# Patient Record
Sex: Male | Born: 1965 | Race: White | Hispanic: No | Smoking: Never smoker
Health system: Southern US, Community
[De-identification: ages and names within clinical notes are randomized; demographics above are authoritative.]

## PROBLEM LIST (undated history)

## (undated) DIAGNOSIS — J1282 Pneumonia due to coronavirus disease 2019: Secondary | ICD-10-CM

## (undated) DIAGNOSIS — A419 Sepsis, unspecified organism: Secondary | ICD-10-CM

## (undated) DIAGNOSIS — Z87442 Personal history of urinary calculi: Secondary | ICD-10-CM

## (undated) DIAGNOSIS — U071 COVID-19: Secondary | ICD-10-CM

## (undated) DIAGNOSIS — C801 Malignant (primary) neoplasm, unspecified: Secondary | ICD-10-CM

## (undated) DIAGNOSIS — G7281 Critical illness myopathy: Secondary | ICD-10-CM

## (undated) DIAGNOSIS — I10 Essential (primary) hypertension: Secondary | ICD-10-CM

## (undated) DIAGNOSIS — R6521 Severe sepsis with septic shock: Secondary | ICD-10-CM

## (undated) DIAGNOSIS — E119 Type 2 diabetes mellitus without complications: Secondary | ICD-10-CM

## (undated) DIAGNOSIS — M1990 Unspecified osteoarthritis, unspecified site: Secondary | ICD-10-CM

## (undated) DIAGNOSIS — M4316 Spondylolisthesis, lumbar region: Secondary | ICD-10-CM

## (undated) DIAGNOSIS — J9621 Acute and chronic respiratory failure with hypoxia: Secondary | ICD-10-CM

## (undated) HISTORY — PX: COLONOSCOPY W/ BIOPSIES AND POLYPECTOMY: SHX1376

## (undated) HISTORY — PX: KIDNEY STONE SURGERY: SHX686

## (undated) HISTORY — PX: SKIN CANCER EXCISION: SHX779

## (undated) HISTORY — PX: BACK SURGERY: SHX140

## (undated) HISTORY — PX: CYSTOSCOPY: SUR368

---

## 2006-04-16 HISTORY — PX: LUMBAR FUSION: SHX111

## 2007-01-21 ENCOUNTER — Inpatient Hospital Stay (HOSPITAL_COMMUNITY): Admission: RE | Admit: 2007-01-21 | Discharge: 2007-01-24 | Payer: Self-pay | Admitting: Neurosurgery

## 2010-08-29 NOTE — Op Note (Signed)
NAME:  Steve Parker, Steve Parker                  ACCOUNT NO.:  [id number]   MEDICAL RECORD NO.:  [id number]          PATIENT TYPE:  INP   LOCATION:  NA                           FACILITY:  [facility]   PHYSICIAN:  Danae Orleans. Venetia Maxon, M.D.  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   PROCEDURE:  1. L3 Gill procedure.  2. L3-4 diskectomy and resection of synovial cyst.  3. Posterior lumbar interbody fusion, L3-L4 with PEEK interbody cages      and morselized bone autograft and Osteocel and number 4 with      nonsegmental instrumentation posteriorly with the pedicle screw      fixation L3-L4 levels.  4. Posterolateral arthrodesis with morselized bone autograft and      Osteocel.   SURGEON:  Danae Orleans. Venetia Maxon, M.D.   ASSISTANT:  Georgiann Cocker, RN and Hewitt Shorts, M.D.   ANESTHESIA:  General endotracheal anesthesia.   BLOOD LOSS:  250 mL   COMPLICATIONS:  None.   DISPOSITION:  Recovery.   INDICATIONS:  Steve Parker is a 45 year old morbidly obese man with L3  pars defects bilaterally with marked spondylolisthesis of L3 on 4 with a  large synovial cyst causing significant nerve root compression and  additionally a herniated disk at the L3-4 level on the left.  He has  severe left leg pain.  It was elected to take him to surgery for  decompression and fusion at this affected level.   PROCEDURE:  Mr. Martino was brought to the operating room.  Following  satisfactory and uncomplicated induction of general endotracheal  anesthesia and placement of intravenous lines, Foley catheter, he was  placed in a prone position on the  [place] table.  His soft tissue and  bony prominences were padded appropriately.  Skin was then shaved and  then area of planned incision was infiltrated with 0.25% Marcaine and  0.5% lidocaine with 1:300,000 epinephrine after prepping draping the  usual fashion.  Incision was carried through copious adipose tissue to  the lumbodorsal fascia which was incised bilaterally exposing the L3  transverse processes bilaterally and L4 transverse processes  bilaterally.  There was marked degeneration and hypermobility of the L3  posterior elements.  After intraoperative x-ray was performed confirming  correct level, a Gill procedure of L3 was performed with removal of  posterior elements and resection of synovial cyst which was causing a  significant spinal canal stenosis.  The lateral aspect of spinal canal  were decompressed with Kerrison rongeurs and hypertrophied ligamentous  tissue was also removed.  The L3 and L4 nerve roots were decompressed  widely as they extended out the neural foramina.  There was a fragment  of herniated disk material compressing the left L3  and L4 nerve roots  and the fragment of disk was removed.  Subsequently laminar spreader was  placed to open up the interspace and thorough diskectomy was performed  bilaterally using a variety of pituitary rongeurs and end plate  preparation curettes.  After trial sizing it was elected to use a 10-mm  PEEK interbody cage.  This was packed with morcellized bone autograft  and also with Osteocel and the 10 mm cages were placed on either side of  the interspace.  In addition a significant amount of additional of bone  graft material and Osteocel was placed and this was tamped into  position.  Subsequently, intraoperative x-ray demonstrated well-  positioned interbody cages, pedicle screw fixation was then utilized  with a 45 x 6.5 mm screws, two at L3 and two at L4.  All screws had  excellent purchase and their positioning was  confirmed on AP and lateral  fluoroscopy.  The posterolateral region had been decorticated prior to  placing pedicle screws and remaining bone graft was placed bilaterally  L3 through L4 levels.  40-mm rods were placed and locked down in situ.  Prior to placing bone graft, the wound was extensively irrigated with  bacitracin saline.  Self-retaining tractor was removed.  The lumbodorsal  fascia was closed with 1 Vicryl sutures, subcutaneous tissues were  approximated 2-0 Vicryl interrupted inverted sutures.  Skin edges  reapproximated interrupted 3-0 Vicryl subcuticular stitch.  Wound was  dressed with Benzoin, Steri-Strips, Telfa gauze and tape.  The patient  was extubated in the operating room and taken to the recovery room in  stable and satisfactory condition having tolerated his operation well.  All counts correct at the end of the case.      Danae Orleans. Venetia Maxon, M.D.  Electronically Signed     JDS/MEDQ  D:  01/21/2007  T:  01/22/2007  Job:  [id number]

## 2010-09-01 NOTE — Discharge Summary (Signed)
NAME:  Steve Parker, Steve Parker                  ACCOUNT NO.:  [id number]   MEDICAL RECORD NO.:  [id number]          PATIENT TYPE:  INP   LOCATION:  3035                         FACILITY:  [facility]   PHYSICIAN:  Danae Orleans. Venetia Maxon, M.D.  DATE OF BIRTH:  03/22/1966   DATE OF ADMISSION:  01/21/2007  DATE OF DISCHARGE:  01/24/2007                               DISCHARGE SUMMARY   REASON FOR ADMISSION:  1. Lumbar disk herniation.  2. Spondylolisthesis.  3. Synovial cyst.  4. Long-term use of steroids.  5. Morbid obesity.  6. Disk degeneration.  7. Esophageal reflux.  8. Hypertension.   DISCHARGE DIAGNOSES:  1. Lumbar disk herniation.  2. Spondylolisthesis.  3. Synovial cyst.  4. Long-term use of steroids.  5. Morbid obesity.  6. Disk degeneration.  7. Esophageal reflux.  8. Hypertension.   HOSPITAL COURSE:  Steve Parker is a 45 year old, morbidly obese man with  L3-4 spondylolisthesis and spondylolysis.  He had a large disk  herniation and severe left leg pain.  He was admitted to the hospital on  same day as procedure basis and underwent L3 Gill procedure with L3-4  decompression, diskectomy, posterior lumbar interbody fusion and pedicle  screw fixation with posterolateral arthrodesis.  He mobilized slow after  surgery and was up and ambulating and was discharged home on October 10,  in stable, satisfactory condition having tolerated his operation and  hospitalization well.   DISCHARGE MEDICATIONS:  1. Percocet 10/325 one to two every 4-6 hours as needed for pain.  2. Flexeril 10 mg up to 3 times daily as needed.   FOLLOW UP:  Follow up with Dr. Venetia Maxon in the office in 3 weeks.     Danae Orleans. Venetia Maxon, M.D.  Electronically Signed    JDS/MEDQ  D:  02/13/2007  T:  02/14/2007  Job:  [id number]

## 2011-01-25 LAB — CBC
HCT: 48.9
Hemoglobin: 16.5
MCHC: 33.8
MCV: 85.8
Platelets: 300
RBC: 5.7
RDW: 12.7
WBC: 9.1

## 2011-01-25 LAB — BASIC METABOLIC PANEL
CO2: 27
Chloride: 103
GFR calc Af Amer: >60
Potassium: 4.5
Sodium: 141

## 2011-01-25 LAB — TYPE AND SCREEN: Antibody Screen: NEGATIVE

## 2011-01-25 LAB — ABO/RH: ABO/RH(D): O POS

## 2018-06-02 ENCOUNTER — Other Ambulatory Visit: Payer: Self-pay | Admitting: Neurosurgery

## 2018-06-02 DIAGNOSIS — S0540XA Penetrating wound of orbit with or without foreign body, unspecified eye, initial encounter: Secondary | ICD-10-CM

## 2018-06-02 DIAGNOSIS — M4316 Spondylolisthesis, lumbar region: Secondary | ICD-10-CM

## 2018-06-12 ENCOUNTER — Ambulatory Visit
Admission: RE | Admit: 2018-06-12 | Discharge: 2018-06-12 | Disposition: A | Discharge status: Home / Self Care | Payer: 59 | Source: Ambulatory Visit | Attending: Neurosurgery | Admitting: Neurosurgery

## 2018-06-12 DIAGNOSIS — M4316 Spondylolisthesis, lumbar region: Secondary | ICD-10-CM

## 2018-06-12 DIAGNOSIS — S0540XA Penetrating wound of orbit with or without foreign body, unspecified eye, initial encounter: Secondary | ICD-10-CM

## 2018-06-12 MED ORDER — GADOBENATE DIMEGLUMINE 529 MG/ML IV SOLN
20.0000 mL | Freq: Once | INTRAVENOUS | Status: AC | PRN
  Administered 2018-06-12: 20 mL via INTRAVENOUS

## 2018-06-13 ENCOUNTER — Other Ambulatory Visit: Payer: Self-pay

## 2018-06-17 ENCOUNTER — Other Ambulatory Visit: Payer: Self-pay | Admitting: Neurosurgery

## 2018-07-18 NOTE — Pre-Procedure Instructions (Signed)
Steve Parker  07/18/2018     [facility], [place] [address] [place] [address] Phone: [phone] Fax: [phone]   Your procedure is scheduled on Tuesday, July 29, 2018  Report to [facility] at 5:30 A.M.  Call this number if you have problems the morning of surgery:  [phone]   Remember: Brush your teeth the morning of surgery with your regular toothpaste.  Do not eat or drink after midnight Monday, July 28, 2018  Take these medicines the morning of surgery with A SIP OF WATER : amLODipine (NORVASC), if needed: traMADol Veatrice Bourbon) for pain  Stop taking Aspirin (unless otherwise advised by surgeon), vitamins, fish oil and herbal medications. Do not take any NSAIDs ie: Ibuprofen, Advil, Naproxen (Aleve), Motrin, BC and Goody Powder; stop Tuesday 07/22/18   How to Manage Your Diabetes Before and After Surgery  Why is it important to control my blood sugar before and after surgery? . Improving blood sugar levels before and after surgery helps healing and can limit problems. . A way of improving blood sugar control is eating a healthy diet by: o  Eating less sugar and carbohydrates o  Increasing activity/exercise o  Talking with your doctor about reaching your blood sugar goals . High blood sugars (greater than 180 mg/dL) can raise your risk of infections and slow your recovery, so you will need to focus on controlling your diabetes during the weeks before surgery. . Make sure that the doctor who takes care of your diabetes knows about your planned surgery including the date and location.  How do I manage my blood sugar before surgery? . Check your blood sugar at least 4 times a day, starting 2 days before surgery, to make sure that the level is not too high or low. o Check your blood sugar the morning of your surgery when you wake up and every 2 hours until you get to the Short Stay unit. . If your  blood sugar is less than 70 mg/dL, you will need to treat for low blood sugar: o Do not take insulin. o Treat a low blood sugar (less than 70 mg/dL) with  cup of clear juice (cranberry or apple), 4 glucose tablets, OR glucose gel. Recheck blood sugar in 15 minutes after treatment (to make sure it is greater than 70 mg/dL). If your blood sugar is not greater than 70 mg/dL on recheck, call [phone] o  for further instructions. . Report your blood sugar to the short stay nurse when you get to Short Stay.  . If you are admitted to the hospital after surgery: o Your blood sugar will be checked by the staff and you will probably be given insulin after surgery (instead of oral diabetes medicines) to make sure you have good blood sugar levels. o The goal for blood sugar control after surgery is 80-180 mg/dL.  WHAT DO I DO ABOUT MY DIABETES MEDICATION? Do not take glyBURIDE (DIABETA) the night before surgery  . Do not take oral diabetes medicines (pills) the morning of surgery such as metFORMIN (GLUCOPHAGE) and glyBURIDE (DIABETA)  . THE NIGHT BEFORE SURGERY, take 15 units of LEVEMIR  insulin.    Reviewed and Endorsed by [facility] Patient Education Committee, August 2015   Do not wear jewelry, make-up or nail polish.  Do not wear lotions, powders, or perfumes, or deodorant.  Do not shave 48 hours prior to surgery.  Men may shave  face and neck.  Do not bring valuables to the hospital.  [facility] is not responsible for any belongings or valuables.  Contacts, dentures or bridgework may not be worn into surgery.  Leave your suitcase in the car.  After surgery it may be brought to your room. Patients discharged the day of surgery will not be allowed to drive home.  Special instructions:See " [place] Preparing For Surgery" sheet. Please read over the following fact sheets that you were given. Pain Booklet, Coughing and Deep Breathing, MRSA Information and Surgical Site Infection Prevention

## 2018-07-21 ENCOUNTER — Other Ambulatory Visit: Payer: Self-pay

## 2018-07-21 ENCOUNTER — Encounter (HOSPITAL_COMMUNITY): Payer: Self-pay

## 2018-07-21 ENCOUNTER — Encounter (HOSPITAL_COMMUNITY)
Admission: RE | Admit: 2018-07-21 | Discharge: 2018-07-21 | Disposition: A | Discharge status: Home / Self Care | Payer: 59 | Source: Ambulatory Visit | Attending: Neurosurgery | Admitting: Neurosurgery

## 2018-07-21 DIAGNOSIS — Z01818 Encounter for other preprocedural examination: Secondary | ICD-10-CM | POA: Diagnosis not present

## 2018-07-21 DIAGNOSIS — I10 Essential (primary) hypertension: Secondary | ICD-10-CM | POA: Insufficient documentation

## 2018-07-21 DIAGNOSIS — R9431 Abnormal electrocardiogram [ECG] [EKG]: Secondary | ICD-10-CM | POA: Diagnosis not present

## 2018-07-21 HISTORY — DX: Malignant (primary) neoplasm, unspecified: C80.1

## 2018-07-21 HISTORY — DX: Personal history of urinary calculi: Z87.442

## 2018-07-21 HISTORY — DX: Essential (primary) hypertension: I10

## 2018-07-21 HISTORY — DX: Type 2 diabetes mellitus without complications: E11.9

## 2018-07-21 HISTORY — DX: Spondylolisthesis, lumbar region: M43.16

## 2018-07-21 LAB — BASIC METABOLIC PANEL
Anion gap: 8 (ref 5–15)
BUN: 14 mg/dL (ref 6–20)
CO2: 25 mmol/L (ref 22–32)
Calcium: 9.2 mg/dL (ref 8.9–10.3)
Chloride: 108 mmol/L (ref 98–111)
Creatinine, Ser: 1.03 mg/dL (ref 0.61–1.24)
GFR calc Af Amer: >60 mL/min (ref >60)
GFR calc non Af Amer: >60 mL/min (ref >60)
Glucose, Bld: 163 mg/dL — ABNORMAL HIGH (ref 70–99)
Potassium: 4.8 mmol/L (ref 3.5–5.1)
Sodium: 141 mmol/L (ref 135–145)

## 2018-07-21 LAB — CBC
HCT: 50.7 % (ref 39.0–52.0)
Hemoglobin: 15.3 g/dL (ref 13.0–17.0)
MCH: 26.9 pg (ref 26.0–34.0)
MCHC: 30.2 g/dL (ref 30.0–36.0)
MCV: 89.3 fL (ref 80.0–100.0)
Platelets: 280 10*3/uL (ref 150–400)
RBC: 5.68 MIL/uL (ref 4.22–5.81)
RDW: 13.1 % (ref 11.5–15.5)
WBC: 9.3 10*3/uL (ref 4.0–10.5)
nRBC: 0 % (ref 0.0–0.2)

## 2018-07-21 LAB — TYPE AND SCREEN
ABO/RH(D): O POS
Antibody Screen: NEGATIVE

## 2018-07-21 LAB — SURGICAL PCR SCREEN
MRSA, PCR: NEGATIVE
Staphylococcus aureus: NEGATIVE

## 2018-07-21 LAB — GLUCOSE, CAPILLARY: Glucose-Capillary: 151 mg/dL — ABNORMAL HIGH (ref 70–99)

## 2018-07-21 LAB — HEMOGLOBIN A1C
Hgb A1c MFr Bld: 11.8 % — ABNORMAL HIGH (ref 4.8–5.6)
Mean Plasma Glucose: 291.96 mg/dL

## 2018-07-21 NOTE — Progress Notes (Signed)
Anesthesia Chart Review:  Case:  [id number] Date/Time:  08/28/18 0715   Procedure:  Lumbar 4-5, Lumbar 5 Sacral 1 Posterior lumbar interbody fusion with exploration of Lumbar 3-4 Fusion (N/A Back) - Lumbar 4-5, Lumbar 5 Sacral 1 Posterior lumbar interbody fusion with exploration of Lumbar 3-4 Fusion   Anesthesia type:  General   Pre-op diagnosis:  Spondylolisthesis, Lumbar region   Location:  [facility] OR ROOM 21 / [place] [facility]   Surgeon:  Erline Levine, MD      DISCUSSION: 53 yo male never smoker. Pertinent hx includes HTN, DMII.  PVC bigeminy noted on preop EKG. No prior tracing for comparison. Pt denies any CV symptoms, says he has actually intentionally lost 20+ lbs over the past 2 months in preparation for surgery.Tracing faxed to PCP Dr. Unk Lightning for review and asked for input regarding whether any additional workup planned prior to surgery. Preop labs also significant for A1c 11.8; this result was also routed to PCP as well as called to Dr. Melven Sartorius office.  Case appears to have been postponed to 5/14. Pt should be cleared by PCP prior to eventual surgery given uncontrolled DMII.  VS: BP (!) 145/82   Pulse 80   Temp 36.6 C (Oral)   Resp 18   Ht 6\' 1"  (1.854 m)   Wt 119.3 kg   SpO2 100%   BMI 34.71 kg/m   PROVIDERS: Janace Litten, MD is PCP  LABS: Uncontrolled DMII (all labs ordered are listed, but only abnormal results are displayed)  Labs Reviewed  GLUCOSE, CAPILLARY - Abnormal; Notable for the following components:      Result Value   Glucose-Capillary 151 (*)    All other components within normal limits  HEMOGLOBIN A1C - Abnormal; Notable for the following components:   Hgb A1c MFr Bld 11.8 (*)    All other components within normal limits  BASIC METABOLIC PANEL - Abnormal; Notable for the following components:   Glucose, Bld 163 (*)    All other components within normal limits  SURGICAL PCR SCREEN  CBC  TYPE AND SCREEN      EKG: 07/21/2018: Sinus rhythm with frequent  Premature ventricular complexes in a pattern of bigeminy. Rate 74.  CV: N/A  Past Medical History:  Diagnosis Date  . 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 Diabetes mellitus without complication ([place])   . History of kidney stones   . Hypertension   . Spondylolisthesis of lumbar region     Past Surgical History:  Procedure Laterality Date  . BACK SURGERY    . COLONOSCOPY W/ BIOPSIES AND POLYPECTOMY    . KIDNEY STONE SURGERY    . SKIN CANCER EXCISION      MEDICATIONS: . amLODipine (NORVASC) 5 MG tablet  . glyBURIDE (DIABETA) 5 MG tablet  . LEVEMIR FLEXTOUCH 100 UNIT/ML Pen  . lisinopril (PRINIVIL,ZESTRIL) 40 MG tablet  . metFORMIN (GLUCOPHAGE) 1000 MG tablet  . traMADol (ULTRAM) 50 MG tablet   No current facility-administered medications for this encounter.    Wynonia Musty [facility] Short Stay Center/Anesthesiology Phone [phone] 07/23/2018 8:48 AM

## 2018-07-21 NOTE — Progress Notes (Signed)
Pt denies SOB, chest pain, and being under the care of a cardiologist. Pt denies having a stress test, echo and cardiac cath. Pt stated that he is under the care of Dr. Unk Lightning, PCP. Pt denies having an EKG chest x ray within the last year. Pt denies recent labs. Pt stated that last A1c was 5 months ago. Pt stated that latest FBG ranges between 50's -60's.   Coronavirus Screening  Pt denies that he and family members have experienced the following symptoms:  Cough yes/no: No Fever (>100.54F)  yes/no: No Runny nose yes/no: No Sore throat yes/no: No Difficulty breathing/shortness of breath  yes/no: No  Have you or a family member traveled in the last 14 days and where? yes/no: No  Pt reminded that hospital visitation restrictions are in effect and the importance of the restrictions.  Pt verbalized understanding of all pre-op instructions.  PA, Anesthesiology, asked to review pt abnormal labs and EKG.

## 2018-09-11 NOTE — Pre-Procedure Instructions (Signed)
Steve Parker  09/11/2018     [facility] [address] [place] [address] [place] [address] Phone: [phone] Fax: [phone]   Your procedure is scheduled on Tuesday, June 2nd  Report to [facility] Entrance A at  7:00 A.M.  Call this number if you have problems the morning of surgery:  [phone]   Remember:  Do not eat or drink after midnight.    Take these medicines the morning of surgery with A SIP OF WATER   amLODipine (NORVASC)  If needed - traMADol (ULTRAM)   Follow your surgeon's instructions on when to stop Aspirin.  If no instructions were given by your surgeon then you will need to call the office to get those instructions.    7 days prior to surgery STOP taking any Aspirin (unless otherwise instructed by your surgeon), Aleve, Naproxen, Ibuprofen, Motrin, Advil, Goody's, BC's, all herbal medications, fish oil, and all vitamins.  How to Manage Your Diabetes Before and After Surgery  Why is it important to control my blood sugar before and after surgery? . Improving blood sugar levels before and after surgery helps healing and can limit problems. . A way of improving blood sugar control is eating a healthy diet by: o  Eating less sugar and carbohydrates o  Increasing activity/exercise o  Talking with your doctor about reaching your blood sugar goals . High blood sugars (greater than 180 mg/dL) can raise your risk of infections and slow your recovery, so you will need to focus on controlling your diabetes during the weeks before surgery. . Make sure that the doctor who takes care of your diabetes knows about your planned surgery including the date and location.  How do I manage my blood sugar before surgery? . Check your blood sugar at least 4 times a day, starting 2 days before surgery, to make sure that the level is not too high or low. o Check your blood sugar the morning of your surgery when you wake up and every  2 hours until you get to the Short Stay unit. . If your blood sugar is less than 70 mg/dL, you will need to treat for low blood sugar: o Do not take insulin. o Treat a low blood sugar (less than 70 mg/dL) with  cup of clear juice (cranberry or apple), 4 glucose tablets, OR glucose gel. Recheck blood sugar in 15 minutes after treatment (to make sure it is greater than 70 mg/dL). If your blood sugar is not greater than 70 mg/dL on recheck, call [phone] o  for further instructions. . Report your blood sugar to the short stay nurse when you get to Short Stay.  . If you are admitted to the hospital after surgery: o Your blood sugar will be checked by the staff and you will probably be given insulin after surgery (instead of oral diabetes medicines) to make sure you have good blood sugar levels. o The goal for blood sugar control after surgery is 80-180 mg/dL.  WHAT DO I DO ABOUT MY DIABETES MEDICATION?  Marland Kitchen Do not take metFORMIN (GLUCOPHAGE) or glyBURIDE (DIABETA)/oral diabetes medicines (pills) the morning of surgery.  . THE NIGHT BEFORE SURGERY, take 15 units of LEVEMIR insulin.     . The day of surgery, do not take other diabetes injectables, including Byetta (exenatide), Bydureon (exenatide ER), Victoza (liraglutide), or Trulicity (dulaglutide).  . If your CBG is greater than 220 mg/dL, you may take  of  your sliding scale (correction) dose of insulin.  Reviewed and Endorsed by [facility] Patient Education Committee, August 2015   Do not wear jewelry, make-up or nail polish.  Do not wear lotions, powders, or perfumes, or deodorant.  Do not shave 48 hours prior to surgery.  Men may shave face and neck.  Do not bring valuables to the hospital.  [facility] is not responsible for any belongings or valuables.  Contacts, dentures or bridgework may not be worn into surgery.  Leave your suitcase in the car.  After surgery it may be brought to your room.  For patients admitted to the  hospital, discharge time will be determined by your treatment team.  Patients discharged the day of surgery will not be allowed to drive home.   Special instructions:                                        [place]- Preparing For Surgery  Before surgery, you can play an important role. Because skin is not sterile, your skin needs to be as free of germs as possible. You can reduce the number of germs on your skin by washing with CHG (chlorahexidine gluconate) Soap before surgery.  CHG is an antiseptic cleaner which kills germs and bonds with the skin to continue killing germs even after washing.    Oral Hygiene is also important to reduce your risk of infection.  Remember - BRUSH YOUR TEETH THE MORNING OF SURGERY WITH YOUR REGULAR TOOTHPASTE  Please do not use if you have an allergy to CHG or antibacterial soaps. If your skin becomes reddened/irritated stop using the CHG.  Do not shave (including legs and underarms) for at least 48 hours prior to first CHG shower. It is OK to shave your face.  Please follow these instructions carefully.   1. Shower the NIGHT BEFORE SURGERY and the MORNING OF SURGERY with CHG.   2. If you chose to wash your hair, wash your hair first as usual with your normal shampoo.  3. After you shampoo, rinse your hair and body thoroughly to remove the shampoo.  4. Use CHG as you would any other liquid soap. You can apply CHG directly to the skin and wash gently with a scrungie or a clean washcloth.   5. Apply the CHG Soap to your body ONLY FROM THE NECK DOWN.  Do not use on open wounds or open sores. Avoid contact with your eyes, ears, mouth and genitals (private parts). Wash Face and genitals (private parts)  with your normal soap.  6. Wash thoroughly, paying special attention to the area where your surgery will be performed.  7. Thoroughly rinse your body with warm water from the neck down.  8. DO NOT shower/wash with your normal soap after using and rinsing off  the CHG Soap.  9. Pat yourself dry with a CLEAN TOWEL.  10. Wear CLEAN PAJAMAS to bed the night before surgery, wear comfortable clothes the morning of surgery  11. Place CLEAN SHEETS on your bed the night of your first shower and DO NOT SLEEP WITH PETS.  Day of Surgery:  Do not apply any deodorants/lotions.  Please wear clean clothes to the hospital/surgery center.   Remember to brush your teeth WITH YOUR REGULAR TOOTHPASTE.  Please read over the following fact sheets that you were given. Pain Booklet, Coughing and Deep Breathing, MRSA Information and  Surgical Site Infection Prevention

## 2018-09-12 ENCOUNTER — Encounter (HOSPITAL_COMMUNITY): Payer: Self-pay

## 2018-09-12 ENCOUNTER — Encounter (HOSPITAL_COMMUNITY)
Admission: RE | Admit: 2018-09-12 | Discharge: 2018-09-12 | Disposition: A | Discharge status: Home / Self Care | Payer: 59 | Source: Ambulatory Visit | Attending: Neurosurgery | Admitting: Neurosurgery

## 2018-09-12 ENCOUNTER — Other Ambulatory Visit (HOSPITAL_COMMUNITY)
Admission: RE | Admit: 2018-09-12 | Discharge: 2018-09-12 | Disposition: A | Discharge status: Home / Self Care | Payer: 59 | Source: Ambulatory Visit | Attending: Neurosurgery | Admitting: Neurosurgery

## 2018-09-12 ENCOUNTER — Other Ambulatory Visit: Payer: Self-pay

## 2018-09-12 ENCOUNTER — Encounter (HOSPITAL_COMMUNITY): Payer: Self-pay | Admitting: *Deleted

## 2018-09-12 DIAGNOSIS — Z1159 Encounter for screening for other viral diseases: Secondary | ICD-10-CM | POA: Diagnosis not present

## 2018-09-12 DIAGNOSIS — Z01812 Encounter for preprocedural laboratory examination: Secondary | ICD-10-CM | POA: Insufficient documentation

## 2018-09-12 HISTORY — DX: Unspecified osteoarthritis, unspecified site: M19.90

## 2018-09-12 LAB — CBC
HCT: 49.8 % (ref 39.0–52.0)
Hemoglobin: 15.6 g/dL (ref 13.0–17.0)
MCH: 27.5 pg (ref 26.0–34.0)
MCHC: 31.3 g/dL (ref 30.0–36.0)
MCV: 87.8 fL (ref 80.0–100.0)
Platelets: 309 10*3/uL (ref 150–400)
RBC: 5.67 MIL/uL (ref 4.22–5.81)
RDW: 13.3 % (ref 11.5–15.5)
WBC: 12.2 10*3/uL — ABNORMAL HIGH (ref 4.0–10.5)
nRBC: 0 % (ref 0.0–0.2)

## 2018-09-12 LAB — BASIC METABOLIC PANEL
Anion gap: 10 (ref 5–15)
BUN: 9 mg/dL (ref 6–20)
CO2: 24 mmol/L (ref 22–32)
Calcium: 9.7 mg/dL (ref 8.9–10.3)
Chloride: 107 mmol/L (ref 98–111)
Creatinine, Ser: 1.06 mg/dL (ref 0.61–1.24)
GFR calc Af Amer: >60 mL/min (ref >60)
GFR calc non Af Amer: >60 mL/min (ref >60)
Glucose, Bld: 84 mg/dL (ref 70–99)
Potassium: 4.3 mmol/L (ref 3.5–5.1)
Sodium: 141 mmol/L (ref 135–145)

## 2018-09-12 LAB — SURGICAL PCR SCREEN
MRSA, PCR: NEGATIVE
Staphylococcus aureus: NEGATIVE

## 2018-09-12 LAB — TYPE AND SCREEN
ABO/RH(D): O POS
Antibody Screen: NEGATIVE

## 2018-09-12 LAB — GLUCOSE, CAPILLARY: Glucose-Capillary: 96 mg/dL (ref 70–99)

## 2018-09-12 NOTE — Anesthesia Preprocedure Evaluation (Addendum)
Anesthesia Evaluation  Patient identified by MRN, date of birth, ID band Patient awake    Reviewed: Allergy & Precautions, NPO status , Patient's Chart, lab work & pertinent test results  History of Anesthesia Complications Negative for: history of anesthetic complications  Airway Mallampati: IV  TM Distance: >3 FB Neck ROM: Full    Dental  (+) Teeth Intact   Pulmonary neg pulmonary ROS, neg recent URI,    breath sounds clear to auscultation       Cardiovascular hypertension, Pt. on medications (-) angina(-) Past MI and (-) CHF  Rhythm:Regular     Neuro/Psych negative neurological ROS  negative psych ROS   GI/Hepatic negative GI ROS, Neg liver ROS,   Endo/Other  diabetes, Type 2Morbid obesity  Renal/GU      Musculoskeletal  (+) Arthritis ,   Abdominal   Peds  Hematology   Anesthesia Other Findings   Reproductive/Obstetrics                            Anesthesia Physical Anesthesia Plan  ASA: II  Anesthesia Plan: General   Post-op Pain Management:    Induction: Intravenous  PONV Risk Score and Plan: 2 and Ondansetron and Dexamethasone  Airway Management Planned: Oral ETT  Additional Equipment: None  Intra-op Plan:   Post-operative Plan: Extubation in OR  Informed Consent: I have reviewed the patients History and Physical, chart, labs and discussed the procedure including the risks, benefits and alternatives for the proposed anesthesia with the patient or authorized representative who has indicated his/her understanding and acceptance.     Dental advisory given  Plan Discussed with: CRNA and Surgeon  Anesthesia Plan Comments: (See recent PAT note by Karoline Caldwell, PA-C 07/21/18. In the interim, pt has been seen by his PCP for management of DMII and by cardiology for eval of PVCs and cleared for surgery. A1c 6.5 on 5/29, down from 11.8 previously.  TTE 08/27/18 (care  everywhere): Summary Normal mitral valve structure and mobility. No significant regurgitation. There is trace aortic sclerosis noted, with no evidence of stenosis. No aortic stenosis. No aortic regurgitation. Normal left ventricle size and systolic function, with no segmental abnormality. ejection fraction estimated 63% abnormal diastolic function)     Anesthesia Quick Evaluation

## 2018-09-12 NOTE — Progress Notes (Addendum)
PCP - Dr Janace Litten  Cardiologist - Dr Abran Richard Chest x-ray -   EKG - 4/13/2020[facility]- requested EKG tacing  Stress Test - na  ECHO - 08/17/2018- careeverywhere  Cardiac Cath - no  Sleep Study - no CPAP - na  LABS-CBC, BMP, T/S. PCR  ASA-na  HA1C-  Fasting Blood Sugar - 58- 130 Checks Blood Sugar ___rarely checks__   AnesthesiaKaroline Caldwell, PA-C to review  Pt denies having chest pain, sob, or fever at this time. All instructions explained to the pt, with a verbal understanding of the material. Pt agrees to go over the instructions while at home for a better understanding. The opportunity to ask questions was provided.  Mr Rosendahl will have COVID test done today and he knows to stay at home and no one should come in his house.

## 2018-09-12 NOTE — Pre-Procedure Instructions (Signed)
Steve Parker  09/12/2018     Your procedure is scheduled on Tuesday, June 2nd  Report to [facility] Entrance A at  7:00 A.M.  Call this number if you have problems the morning of surgery:  [phone]   Do Not take Glyburide Monday evening or Tuesday AM D Not take Metformin Tuesday AM    Remember:  Do not eat or drink after midnight, June 1    Take these medicines the morning of surgery with A SIP OF WATER:  amLODipine (NORVASC)  If needed - traMADol (ULTRAM)   Follow your surgeon's instructions on when to stop Aspirin.  If no instructions were given by your surgeon then you will need to call the office to get those instructions.    7 days prior to surgery STOP taking any Aspirin (unless otherwise instructed by your surgeon), Aleve, Naproxen, Ibuprofen, Motrin, Advil, Goody's, BC's, all herbal medications, fish oil, and all vitamins.  How to Manage Your Diabetes Before and After Surgery  Why is it important to control my blood sugar before and after surgery? . Improving blood sugar levels before and after surgery helps healing and can limit problems. . A way of improving blood sugar control is eating a healthy diet by: o  Eating less sugar and carbohydrates o  Increasing activity/exercise o  Talking with your doctor about reaching your blood sugar goals . High blood sugars (greater than 180 mg/dL) can raise your risk of infections and slow your recovery, so you will need to focus on controlling your diabetes during the weeks before surgery. . Make sure that the doctor who takes care of your diabetes knows about your planned surgery including the date and location.  How do I manage my blood sugar before surgery? . Check your blood sugar at least 4 times a day, starting 2 days before surgery, to make sure that the level is not too high or low. o Check your blood sugar the morning of your surgery when you wake up and every 2 hours until you get to the Short Stay  unit. . If your blood sugar is less than 70 mg/dL, you will need to treat for low blood sugar: o Do not take insulin. o Treat a low blood sugar (less than 70 mg/dL) with  cup of clear juice (cranberry or apple), 4 glucose tablets, OR glucose gel. Recheck blood sugar in 15 minutes after treatment (to make sure it is greater than 70 mg/dL). If your blood sugar is not greater than 70 mg/dL on recheck, call [phone] o  for further instructions. . Report your blood sugar to the short stay nurse when you get to Short Stay.  . If you are admitted to the hospital after surgery: o Your blood sugar will be checked by the staff and you will probably be given insulin after surgery (instead of oral diabetes medicines) to make sure you have good blood sugar levels. o The goal for blood sugar control after surgery is 80-180 mg/dL.  WHAT DO I DO ABOUT MY DIABETES MEDICATION?  Marland Kitchen Do not take metFORMIN (GLUCOPHAGE) or glyBURIDE (DIABETA)/oral diabetes medicines (pills) the morning of surgery.  . THE NIGHT BEFORE SURGERY, take 15 units of LEVEMIR insulin.       Special instructions:                                   [facility]- Preparing For  Surgery  Before surgery, you can play an important role. Because skin is not sterile, your skin needs to be as free of germs as possible. You can reduce the number of germs on your skin by washing with CHG (chlorahexidine gluconate) Soap before surgery.  CHG is an antiseptic cleaner which kills germs and bonds with the skin to continue killing germs even after washing.    Oral Hygiene is also important to reduce your risk of infection.  Remember - BRUSH YOUR TEETH THE MORNING OF SURGERY WITH YOUR REGULAR TOOTHPASTE  Please do not use if you have an allergy to CHG or antibacterial soaps. If your skin becomes reddened/irritated stop using the CHG.  Do not shave (including legs and underarms) for at least 48 hours prior to first CHG shower. It is OK to shave your  face.  Please follow these instructions carefully.   1. Shower the NIGHT BEFORE SURGERY and the MORNING OF SURGERY with CHG.   2. If you chose to wash your hair, wash your hair first as usual with your normal shampoo.  3. After you shampoo, wash your face and private area with the soap you use at home, then rinse your hair and body thoroughly to remove the shampoo and soap.  4. Use CHG as you would any other liquid soap. You can apply CHG directly to the skin and wash gently with a scrungie or a clean washcloth.   5. Apply the CHG Soap to your body ONLY FROM THE NECK DOWN.  Do not use on open wounds or open sores. Avoid contact with your eyes, ears, mouth and genitals (private parts).   6. Wash thoroughly, paying special attention to the area where your surgery will be performed.  7. Thoroughly rinse your body with warm water from the neck down.  8. DO NOT shower/wash with your normal soap after using and rinsing off the CHG Soap.  9. Pat yourself dry with a CLEAN TOWEL.  10. Wear CLEAN PAJAMAS to bed the night before surgery, wear comfortable clothes the morning of surgery  11. Place CLEAN SHEETS on your bed the night of your first shower and DO NOT SLEEP WITH PETS.  Day of Surgery: Shower as Instructed Above Do not wear lotions, powders, or perfumes, or deodorant. Please wear clean clothes to the hospital/surgery center.   Remember to brush your teeth WITH YOUR REGULAR TOOTHPASTE.             Do not wear jewelry, make-up or nail polish.  Men may shave face and neck. Do not wear jewelry, piercing.               Do not bring valuables to the hospital.  [facility] is not responsible for any belongings or valuables.  Contacts, dentures or bridgework may not be worn into surgery.  Leave your suitcase in the car.  After surgery it may be brought to your room.  For patients admitted to the hospital, discharge time will be determined by your treatment team.  Patients discharged  the day of surgery will not be allowed to drive home.   Please read over the following fact sheets that you were given.  Coughing and Deep Breathing, Surgical Site Infections

## 2018-09-13 LAB — NOVEL CORONAVIRUS, NAA (HOSP ORDER, SEND-OUT TO REF LAB; TAT 18-24 HRS): SARS-CoV-2, NAA: NOT DETECTED

## 2018-09-13 LAB — HEMOGLOBIN A1C
Hgb A1c MFr Bld: 6.5 % — ABNORMAL HIGH (ref 4.8–5.6)
Mean Plasma Glucose: 140 mg/dL

## 2018-09-15 MED ORDER — DEXTROSE 5 % IV SOLN
3.0000 g | INTRAVENOUS | Status: AC
  Administered 2018-09-16 (×2): 3 g via INTRAVENOUS
  Filled 2018-09-15: qty 3

## 2018-09-16 ENCOUNTER — Inpatient Hospital Stay (HOSPITAL_COMMUNITY): Payer: 59

## 2018-09-16 ENCOUNTER — Inpatient Hospital Stay (HOSPITAL_COMMUNITY)
Admission: RE | Admit: 2018-09-16 | Discharge: 2018-09-17 | DRG: 455 | Disposition: A | Discharge status: Home / Self Care | Payer: 59 | Attending: Neurosurgery | Admitting: Neurosurgery

## 2018-09-16 ENCOUNTER — Inpatient Hospital Stay (HOSPITAL_COMMUNITY): Payer: 59 | Admitting: Certified Registered Nurse Anesthetist

## 2018-09-16 ENCOUNTER — Encounter (HOSPITAL_COMMUNITY): Payer: Self-pay | Admitting: *Deleted

## 2018-09-16 ENCOUNTER — Inpatient Hospital Stay (HOSPITAL_COMMUNITY): Payer: 59 | Admitting: Physician Assistant

## 2018-09-16 ENCOUNTER — Other Ambulatory Visit: Payer: Self-pay

## 2018-09-16 ENCOUNTER — Inpatient Hospital Stay (HOSPITAL_COMMUNITY)
Admission: RE | Disposition: A | Discharge status: Home / Self Care | Payer: Self-pay | Source: Home / Self Care | Attending: Neurosurgery

## 2018-09-16 DIAGNOSIS — I10 Essential (primary) hypertension: Secondary | ICD-10-CM | POA: Diagnosis present

## 2018-09-16 DIAGNOSIS — M48061 Spinal stenosis, lumbar region without neurogenic claudication: Secondary | ICD-10-CM | POA: Diagnosis present

## 2018-09-16 DIAGNOSIS — Z6834 Body mass index (BMI) 34.0-34.9, adult: Secondary | ICD-10-CM

## 2018-09-16 DIAGNOSIS — E119 Type 2 diabetes mellitus without complications: Secondary | ICD-10-CM | POA: Diagnosis present

## 2018-09-16 DIAGNOSIS — M4316 Spondylolisthesis, lumbar region: Secondary | ICD-10-CM | POA: Diagnosis present

## 2018-09-16 DIAGNOSIS — M5116 Intervertebral disc disorders with radiculopathy, lumbar region: Principal | ICD-10-CM | POA: Diagnosis present

## 2018-09-16 DIAGNOSIS — M5117 Intervertebral disc disorders with radiculopathy, lumbosacral region: Secondary | ICD-10-CM | POA: Diagnosis present

## 2018-09-16 DIAGNOSIS — Z419 Encounter for procedure for purposes other than remedying health state, unspecified: Secondary | ICD-10-CM

## 2018-09-16 HISTORY — PX: LUMBAR FUSION: SHX111

## 2018-09-16 LAB — GLUCOSE, CAPILLARY
Glucose-Capillary: 115 mg/dL — ABNORMAL HIGH (ref 70–99)
Glucose-Capillary: 167 mg/dL — ABNORMAL HIGH (ref 70–99)
Glucose-Capillary: 238 mg/dL — ABNORMAL HIGH (ref 70–99)
Glucose-Capillary: 261 mg/dL — ABNORMAL HIGH (ref 70–99)

## 2018-09-16 SURGERY — POSTERIOR LUMBAR FUSION 2 LEVEL
Anesthesia: General | Site: Back

## 2018-09-16 MED ORDER — HYDROMORPHONE HCL 1 MG/ML IJ SOLN: 0.2500 mg | INTRAMUSCULAR | Status: DC | PRN

## 2018-09-16 MED ORDER — OXYCODONE HCL 5 MG PO TABS
5.0000 mg | ORAL_TABLET | Freq: Once | ORAL | Status: AC | PRN
  Administered 2018-09-16: 5 mg via ORAL

## 2018-09-16 MED ORDER — ONDANSETRON HCL 4 MG/2ML IJ SOLN
INTRAMUSCULAR | Status: AC
  Filled 2018-09-16: qty 2

## 2018-09-16 MED ORDER — GLYBURIDE 5 MG PO TABS
10.0000 mg | ORAL_TABLET | Freq: Two times a day (BID) | ORAL | Status: DC
  Administered 2018-09-16 – 2018-09-17 (×2): 10 mg via ORAL
  Filled 2018-09-16 (×3): qty 2

## 2018-09-16 MED ORDER — HYDROCODONE-ACETAMINOPHEN 10-325 MG PO TABS
2.0000 | ORAL_TABLET | ORAL | Status: DC | PRN
  Administered 2018-09-17 (×2): 2 via ORAL
  Filled 2018-09-16 (×2): qty 2

## 2018-09-16 MED ORDER — PHENYLEPHRINE HCL-NACL 10-0.9 MG/250ML-% IV SOLN
INTRAVENOUS | Status: AC
  Filled 2018-09-16: qty 500

## 2018-09-16 MED ORDER — PHENOL 1.4 % MT LIQD: 1.0000 | OROMUCOSAL | Status: DC | PRN

## 2018-09-16 MED ORDER — ACETAMINOPHEN 325 MG PO TABS: 650.0000 mg | ORAL_TABLET | ORAL | Status: DC | PRN

## 2018-09-16 MED ORDER — OXYCODONE HCL 5 MG PO TABS
ORAL_TABLET | ORAL | Status: AC
  Filled 2018-09-16: qty 1

## 2018-09-16 MED ORDER — ROCURONIUM BROMIDE 100 MG/10ML IV SOLN
INTRAVENOUS | Status: DC | PRN
  Administered 2018-09-16: 100 mg via INTRAVENOUS
  Administered 2018-09-16: 40 mg via INTRAVENOUS

## 2018-09-16 MED ORDER — DEXAMETHASONE SODIUM PHOSPHATE 10 MG/ML IJ SOLN
INTRAMUSCULAR | Status: DC | PRN
  Administered 2018-09-16: 10 mg via INTRAVENOUS

## 2018-09-16 MED ORDER — ONDANSETRON HCL 4 MG PO TABS: 4.0000 mg | ORAL_TABLET | Freq: Four times a day (QID) | ORAL | Status: DC | PRN

## 2018-09-16 MED ORDER — PANTOPRAZOLE SODIUM 40 MG IV SOLR
40.0000 mg | Freq: Every day | INTRAVENOUS | Status: DC
  Administered 2018-09-16: 40 mg via INTRAVENOUS
  Filled 2018-09-16: qty 40

## 2018-09-16 MED ORDER — OXYCODONE HCL 5 MG PO TABS
5.0000 mg | ORAL_TABLET | Freq: Once | ORAL | Status: AC
  Administered 2018-09-16: 5 mg via ORAL

## 2018-09-16 MED ORDER — FLEET ENEMA 7-19 GM/118ML RE ENEM: 1.0000 | ENEMA | Freq: Once | RECTAL | Status: DC | PRN

## 2018-09-16 MED ORDER — SUCCINYLCHOLINE CHLORIDE 20 MG/ML IJ SOLN
INTRAMUSCULAR | Status: DC | PRN
  Administered 2018-09-16: 100 mg via INTRAVENOUS

## 2018-09-16 MED ORDER — PROPOFOL 10 MG/ML IV BOLUS
INTRAVENOUS | Status: DC | PRN
  Administered 2018-09-16: 60 mg via INTRAVENOUS

## 2018-09-16 MED ORDER — INSULIN ASPART 100 UNIT/ML ~~LOC~~ SOLN
0.0000 [IU] | Freq: Three times a day (TID) | SUBCUTANEOUS | Status: DC
  Administered 2018-09-17: 3 [IU] via SUBCUTANEOUS

## 2018-09-16 MED ORDER — SODIUM CHLORIDE 0.9% FLUSH: 3.0000 mL | INTRAVENOUS | Status: DC | PRN

## 2018-09-16 MED ORDER — DOCUSATE SODIUM 100 MG PO CAPS
100.0000 mg | ORAL_CAPSULE | Freq: Two times a day (BID) | ORAL | Status: DC
  Administered 2018-09-16 – 2018-09-17 (×2): 100 mg via ORAL
  Filled 2018-09-16 (×2): qty 1

## 2018-09-16 MED ORDER — ROCURONIUM BROMIDE 10 MG/ML (PF) SYRINGE
PREFILLED_SYRINGE | INTRAVENOUS | Status: AC
  Filled 2018-09-16: qty 20

## 2018-09-16 MED ORDER — BUPIVACAINE LIPOSOME 1.3 % IJ SUSP
INTRAMUSCULAR | Status: DC | PRN
  Administered 2018-09-16: 20 mL

## 2018-09-16 MED ORDER — OXYCODONE HCL 5 MG PO TABS: 5.0000 mg | ORAL_TABLET | ORAL | Status: DC | PRN

## 2018-09-16 MED ORDER — LIDOCAINE 2% (20 MG/ML) 5 ML SYRINGE
INTRAMUSCULAR | Status: AC
  Filled 2018-09-16: qty 10

## 2018-09-16 MED ORDER — CHLORHEXIDINE GLUCONATE CLOTH 2 % EX PADS: 6.0000 | MEDICATED_PAD | Freq: Once | CUTANEOUS | Status: DC

## 2018-09-16 MED ORDER — ACETAMINOPHEN 160 MG/5ML PO SOLN: 1000.0000 mg | Freq: Once | ORAL | Status: DC | PRN

## 2018-09-16 MED ORDER — INSULIN DETEMIR 100 UNIT/ML ~~LOC~~ SOLN
30.0000 [IU] | Freq: Every day | SUBCUTANEOUS | Status: DC
  Administered 2018-09-16: 22:00:00 30 [IU] via SUBCUTANEOUS
  Filled 2018-09-16 (×3): qty 0.3

## 2018-09-16 MED ORDER — PHENYLEPHRINE 40 MCG/ML (10ML) SYRINGE FOR IV PUSH (FOR BLOOD PRESSURE SUPPORT)
PREFILLED_SYRINGE | INTRAVENOUS | Status: AC
  Filled 2018-09-16: qty 20

## 2018-09-16 MED ORDER — MORPHINE SULFATE (PF) 2 MG/ML IV SOLN: 2.0000 mg | INTRAVENOUS | Status: DC | PRN

## 2018-09-16 MED ORDER — BUPIVACAINE HCL (PF) 0.5 % IJ SOLN
INTRAMUSCULAR | Status: AC
  Filled 2018-09-16: qty 30

## 2018-09-16 MED ORDER — TRAMADOL HCL 50 MG PO TABS
50.0000 mg | ORAL_TABLET | Freq: Four times a day (QID) | ORAL | Status: DC | PRN
  Administered 2018-09-16: 50 mg via ORAL
  Filled 2018-09-16: qty 1

## 2018-09-16 MED ORDER — SODIUM CHLORIDE (PF) 0.9 % IJ SOLN
INTRAMUSCULAR | Status: AC
  Filled 2018-09-16: qty 20

## 2018-09-16 MED ORDER — ACETAMINOPHEN 10 MG/ML IV SOLN: 1000.0000 mg | Freq: Once | INTRAVENOUS | Status: DC | PRN

## 2018-09-16 MED ORDER — METFORMIN HCL 500 MG PO TABS
1000.0000 mg | ORAL_TABLET | Freq: Two times a day (BID) | ORAL | Status: DC
  Administered 2018-09-16 – 2018-09-17 (×2): 1000 mg via ORAL
  Filled 2018-09-16 (×2): qty 2

## 2018-09-16 MED ORDER — THROMBIN 5000 UNITS EX SOLR
CUTANEOUS | Status: AC
  Filled 2018-09-16: qty 5000

## 2018-09-16 MED ORDER — ZOLPIDEM TARTRATE 5 MG PO TABS: 5.0000 mg | ORAL_TABLET | Freq: Every evening | ORAL | Status: DC | PRN

## 2018-09-16 MED ORDER — MIDAZOLAM HCL 5 MG/5ML IJ SOLN
INTRAMUSCULAR | Status: DC | PRN
  Administered 2018-09-16: 2 mg via INTRAVENOUS

## 2018-09-16 MED ORDER — BUPIVACAINE LIPOSOME 1.3 % IJ SUSP
20.0000 mL | Freq: Once | INTRAMUSCULAR | Status: DC
  Filled 2018-09-16: qty 20

## 2018-09-16 MED ORDER — INSULIN ASPART 100 UNIT/ML ~~LOC~~ SOLN: 0.0000 [IU] | Freq: Every day | SUBCUTANEOUS | Status: DC

## 2018-09-16 MED ORDER — SUGAMMADEX SODIUM 200 MG/2ML IV SOLN
INTRAVENOUS | Status: DC | PRN
  Administered 2018-09-16: 400 mg via INTRAVENOUS

## 2018-09-16 MED ORDER — HYDROMORPHONE HCL 1 MG/ML IJ SOLN
INTRAMUSCULAR | Status: AC
  Filled 2018-09-16: qty 1

## 2018-09-16 MED ORDER — THROMBIN 5000 UNITS EX SOLR
OROMUCOSAL | Status: DC | PRN
  Administered 2018-09-16: 11:00:00 via TOPICAL
  Administered 2018-09-16: 10:00:00 5 mL via TOPICAL

## 2018-09-16 MED ORDER — MENTHOL 3 MG MT LOZG
1.0000 | LOZENGE | OROMUCOSAL | Status: DC | PRN
  Filled 2018-09-16: qty 9

## 2018-09-16 MED ORDER — 0.9 % SODIUM CHLORIDE (POUR BTL) OPTIME
TOPICAL | Status: DC | PRN
  Administered 2018-09-16: 1000 mL

## 2018-09-16 MED ORDER — DEXAMETHASONE SODIUM PHOSPHATE 10 MG/ML IJ SOLN
INTRAMUSCULAR | Status: AC
  Filled 2018-09-16: qty 2

## 2018-09-16 MED ORDER — MIDAZOLAM HCL 2 MG/2ML IJ SOLN
INTRAMUSCULAR | Status: AC
  Filled 2018-09-16: qty 2

## 2018-09-16 MED ORDER — METHOCARBAMOL 500 MG PO TABS
500.0000 mg | ORAL_TABLET | Freq: Four times a day (QID) | ORAL | Status: DC | PRN
  Administered 2018-09-16 – 2018-09-17 (×3): 500 mg via ORAL
  Filled 2018-09-16 (×3): qty 1

## 2018-09-16 MED ORDER — HYDROMORPHONE HCL 1 MG/ML IJ SOLN
0.2500 mg | INTRAMUSCULAR | Status: DC | PRN
  Administered 2018-09-16 (×3): 0.5 mg via INTRAVENOUS
  Administered 2018-09-16 (×2): 0.25 mg via INTRAVENOUS

## 2018-09-16 MED ORDER — ONDANSETRON HCL 4 MG/2ML IJ SOLN
INTRAMUSCULAR | Status: AC
  Administered 2018-09-16: 18:00:00 4 mg via INTRAVENOUS
  Filled 2018-09-16: qty 2

## 2018-09-16 MED ORDER — ALUM & MAG HYDROXIDE-SIMETH 200-200-20 MG/5ML PO SUSP: 30.0000 mL | Freq: Four times a day (QID) | ORAL | Status: DC | PRN

## 2018-09-16 MED ORDER — METHOCARBAMOL 500 MG PO TABS
ORAL_TABLET | ORAL | Status: AC
  Filled 2018-09-16: qty 1

## 2018-09-16 MED ORDER — ONDANSETRON HCL 4 MG/2ML IJ SOLN
4.0000 mg | Freq: Four times a day (QID) | INTRAMUSCULAR | Status: DC | PRN
  Administered 2018-09-16: 18:00:00 4 mg via INTRAVENOUS

## 2018-09-16 MED ORDER — KCL IN DEXTROSE-NACL 20-5-0.45 MEQ/L-%-% IV SOLN
INTRAVENOUS | Status: DC
  Filled 2018-09-16: qty 1000

## 2018-09-16 MED ORDER — LACTATED RINGERS IV SOLN
INTRAVENOUS | Status: DC
  Administered 2018-09-16: 13:00:00 via INTRAVENOUS
  Administered 2018-09-16: 1000 mL via INTRAVENOUS
  Administered 2018-09-16: 10:00:00 via INTRAVENOUS

## 2018-09-16 MED ORDER — HYDROMORPHONE HCL 1 MG/ML IJ SOLN
0.2500 mg | INTRAMUSCULAR | Status: DC | PRN
  Administered 2018-09-16: 16:00:00 0.5 mg via INTRAVENOUS

## 2018-09-16 MED ORDER — AMLODIPINE BESYLATE 5 MG PO TABS
5.0000 mg | ORAL_TABLET | Freq: Every day | ORAL | Status: DC
  Administered 2018-09-17: 09:00:00 5 mg via ORAL
  Filled 2018-09-16: qty 1

## 2018-09-16 MED ORDER — SODIUM CHLORIDE 0.9 % IV SOLN: 250.0000 mL | INTRAVENOUS | Status: DC

## 2018-09-16 MED ORDER — ONDANSETRON HCL 4 MG/2ML IJ SOLN
INTRAMUSCULAR | Status: DC | PRN
  Administered 2018-09-16: 4 mg via INTRAVENOUS

## 2018-09-16 MED ORDER — OXYCODONE HCL 5 MG/5ML PO SOLN: 5.0000 mg | Freq: Once | ORAL | Status: AC | PRN

## 2018-09-16 MED ORDER — LIDOCAINE-EPINEPHRINE 1 %-1:100000 IJ SOLN
INTRAMUSCULAR | Status: DC | PRN
  Administered 2018-09-16: 10 mL

## 2018-09-16 MED ORDER — FENTANYL CITRATE (PF) 100 MCG/2ML IJ SOLN
INTRAMUSCULAR | Status: DC | PRN
  Administered 2018-09-16: 50 ug via INTRAVENOUS
  Administered 2018-09-16: 100 ug via INTRAVENOUS
  Administered 2018-09-16: 50 ug via INTRAVENOUS
  Administered 2018-09-16: 100 ug via INTRAVENOUS
  Administered 2018-09-16 (×4): 50 ug via INTRAVENOUS

## 2018-09-16 MED ORDER — THROMBIN 20000 UNITS EX SOLR
CUTANEOUS | Status: DC | PRN
  Administered 2018-09-16: 10:00:00 20 mL via TOPICAL

## 2018-09-16 MED ORDER — SODIUM CHLORIDE 0.9 % IV SOLN
INTRAVENOUS | Status: DC | PRN
  Administered 2018-09-16: 40 ug/min via INTRAVENOUS

## 2018-09-16 MED ORDER — SUCCINYLCHOLINE CHLORIDE 200 MG/10ML IV SOSY
PREFILLED_SYRINGE | INTRAVENOUS | Status: AC
  Filled 2018-09-16: qty 10

## 2018-09-16 MED ORDER — BISACODYL 10 MG RE SUPP: 10.0000 mg | Freq: Every day | RECTAL | Status: DC | PRN

## 2018-09-16 MED ORDER — ACETAMINOPHEN 650 MG RE SUPP: 650.0000 mg | RECTAL | Status: DC | PRN

## 2018-09-16 MED ORDER — ACETAMINOPHEN 500 MG PO TABS: 1000.0000 mg | ORAL_TABLET | Freq: Once | ORAL | Status: DC | PRN

## 2018-09-16 MED ORDER — BUPIVACAINE HCL (PF) 0.5 % IJ SOLN
INTRAMUSCULAR | Status: DC | PRN
  Administered 2018-09-16: 10 mL

## 2018-09-16 MED ORDER — LIDOCAINE-EPINEPHRINE 1 %-1:100000 IJ SOLN
INTRAMUSCULAR | Status: AC
  Filled 2018-09-16: qty 1

## 2018-09-16 MED ORDER — SODIUM CHLORIDE 0.9% FLUSH
3.0000 mL | Freq: Two times a day (BID) | INTRAVENOUS | Status: DC
  Administered 2018-09-16 – 2018-09-17 (×2): 3 mL via INTRAVENOUS

## 2018-09-16 MED ORDER — SUCCINYLCHOLINE CHLORIDE 200 MG/10ML IV SOSY
PREFILLED_SYRINGE | INTRAVENOUS | Status: AC
  Filled 2018-09-16: qty 20

## 2018-09-16 MED ORDER — LISINOPRIL 20 MG PO TABS
40.0000 mg | ORAL_TABLET | Freq: Every day | ORAL | Status: DC
  Administered 2018-09-16 – 2018-09-17 (×2): 40 mg via ORAL
  Filled 2018-09-16 (×2): qty 2

## 2018-09-16 MED ORDER — METHOCARBAMOL 1000 MG/10ML IJ SOLN
500.0000 mg | Freq: Four times a day (QID) | INTRAVENOUS | Status: DC | PRN
  Filled 2018-09-16: qty 5

## 2018-09-16 MED ORDER — FENTANYL CITRATE (PF) 250 MCG/5ML IJ SOLN
INTRAMUSCULAR | Status: AC
  Filled 2018-09-16: qty 5

## 2018-09-16 MED ORDER — POLYETHYLENE GLYCOL 3350 17 G PO PACK: 17.0000 g | PACK | Freq: Every day | ORAL | Status: DC | PRN

## 2018-09-16 MED ORDER — PHENYLEPHRINE HCL (PRESSORS) 10 MG/ML IV SOLN
INTRAVENOUS | Status: DC | PRN
  Administered 2018-09-16 (×2): 80 ug via INTRAVENOUS

## 2018-09-16 MED ORDER — CEFAZOLIN SODIUM-DEXTROSE 2-4 GM/100ML-% IV SOLN
2.0000 g | Freq: Three times a day (TID) | INTRAVENOUS | Status: AC
  Administered 2018-09-16 – 2018-09-17 (×2): 2 g via INTRAVENOUS
  Filled 2018-09-16 (×2): qty 100

## 2018-09-16 MED ORDER — LIDOCAINE 2% (20 MG/ML) 5 ML SYRINGE
INTRAMUSCULAR | Status: AC
  Filled 2018-09-16: qty 5

## 2018-09-16 MED ORDER — PROPOFOL 10 MG/ML IV BOLUS
INTRAVENOUS | Status: AC
  Filled 2018-09-16: qty 20

## 2018-09-16 MED ORDER — THROMBIN 20000 UNITS EX SOLR
CUTANEOUS | Status: AC
  Filled 2018-09-16: qty 20000

## 2018-09-16 SURGICAL SUPPLY — 74 items
BASKET BONE COLLECTION (BASKET) ×3 IMPLANT
BLADE CLIPPER SURG (BLADE) ×3 IMPLANT
BUR MATCHSTICK NEURO 3.0 LAGG (BURR) ×3 IMPLANT
BUR PRECISION FLUTE 5.0 (BURR) ×3 IMPLANT
CAGE COROENT LG 10X9X23-12 (Cage) ×6 IMPLANT
CAGE COROENT LG 12X9X23-12 (Cage) ×6 IMPLANT
CANISTER SUCT 3000ML PPV (MISCELLANEOUS) ×3 IMPLANT
CARTRIDGE OIL MAESTRO DRILL (MISCELLANEOUS) ×1 IMPLANT
CONT SPEC 4OZ CLIKSEAL STRL BL (MISCELLANEOUS) ×3 IMPLANT
COVER BACK TABLE 60X90IN (DRAPES) ×3 IMPLANT
COVER WAND RF STERILE (DRAPES) ×3 IMPLANT
DECANTER SPIKE VIAL GLASS SM (MISCELLANEOUS) ×3 IMPLANT
DERMABOND ADVANCED (GAUZE/BANDAGES/DRESSINGS) ×2
DERMABOND ADVANCED .7 DNX12 (GAUZE/BANDAGES/DRESSINGS) ×1 IMPLANT
DIFFUSER DRILL AIR PNEUMATIC (MISCELLANEOUS) ×3 IMPLANT
DRAPE C-ARM 42X72 X-RAY (DRAPES) ×3 IMPLANT
DRAPE C-ARMOR (DRAPES) ×3 IMPLANT
DRAPE LAPAROTOMY 100X72X124 (DRAPES) ×3 IMPLANT
DRAPE SURG 17X23 STRL (DRAPES) ×3 IMPLANT
DRSG OPSITE POSTOP 4X8 (GAUZE/BANDAGES/DRESSINGS) ×3 IMPLANT
DURAPREP 26ML APPLICATOR (WOUND CARE) ×3 IMPLANT
ELECT BLADE 4.0 EZ CLEAN MEGAD (MISCELLANEOUS) ×3
ELECT REM PT RETURN 9FT ADLT (ELECTROSURGICAL) ×3
ELECTRODE BLDE 4.0 EZ CLN MEGD (MISCELLANEOUS) ×1 IMPLANT
ELECTRODE REM PT RTRN 9FT ADLT (ELECTROSURGICAL) ×1 IMPLANT
EVACUATOR 1/8 PVC DRAIN (DRAIN) ×3 IMPLANT
GAUZE 4X4 16PLY RFD (DISPOSABLE) IMPLANT
GAUZE SPONGE 4X4 12PLY STRL (GAUZE/BANDAGES/DRESSINGS) IMPLANT
GLOVE BIO SURGEON STRL SZ8 (GLOVE) ×6 IMPLANT
GLOVE BIOGEL PI IND STRL 7.0 (GLOVE) ×4 IMPLANT
GLOVE BIOGEL PI IND STRL 7.5 (GLOVE) ×2 IMPLANT
GLOVE BIOGEL PI IND STRL 8 (GLOVE) ×5 IMPLANT
GLOVE BIOGEL PI IND STRL 8.5 (GLOVE) ×2 IMPLANT
GLOVE BIOGEL PI INDICATOR 7.0 (GLOVE) ×8
GLOVE BIOGEL PI INDICATOR 7.5 (GLOVE) ×4
GLOVE BIOGEL PI INDICATOR 8 (GLOVE) ×10
GLOVE BIOGEL PI INDICATOR 8.5 (GLOVE) ×4
GLOVE ECLIPSE 8.0 STRL XLNG CF (GLOVE) ×6 IMPLANT
GLOVE EXAM NITRILE XL STR (GLOVE) IMPLANT
GOWN STRL REUS W/ TWL LRG LVL3 (GOWN DISPOSABLE) IMPLANT
GOWN STRL REUS W/ TWL XL LVL3 (GOWN DISPOSABLE) ×3 IMPLANT
GOWN STRL REUS W/TWL 2XL LVL3 (GOWN DISPOSABLE) ×6 IMPLANT
GOWN STRL REUS W/TWL LRG LVL3 (GOWN DISPOSABLE)
GOWN STRL REUS W/TWL XL LVL3 (GOWN DISPOSABLE) ×6
HEMOSTAT POWDER KIT SURGIFOAM (HEMOSTASIS) ×6 IMPLANT
KIT BASIN OR (CUSTOM PROCEDURE TRAY) ×3 IMPLANT
KIT POSITION SURG JACKSON T1 (MISCELLANEOUS) ×3 IMPLANT
KIT TURNOVER KIT B (KITS) ×3 IMPLANT
MILL MEDIUM DISP (BLADE) ×3 IMPLANT
NEEDLE HYPO 25X1 1.5 SAFETY (NEEDLE) ×3 IMPLANT
NEEDLE SPNL 18GX3.5 QUINCKE PK (NEEDLE) ×3 IMPLANT
NS IRRIG 1000ML POUR BTL (IV SOLUTION) ×3 IMPLANT
OIL CARTRIDGE MAESTRO DRILL (MISCELLANEOUS) ×3
PACK LAMINECTOMY NEURO (CUSTOM PROCEDURE TRAY) ×3 IMPLANT
PAD ARMBOARD 7.5X6 YLW CONV (MISCELLANEOUS) ×9 IMPLANT
PATTIES SURGICAL .5 X.5 (GAUZE/BANDAGES/DRESSINGS) IMPLANT
PATTIES SURGICAL .5 X1 (DISPOSABLE) IMPLANT
PATTIES SURGICAL 1X1 (DISPOSABLE) IMPLANT
ROD RELINE TI LORD 5.5X70 (Rod) ×6 IMPLANT
SCREW LOCK RELINE 5.5 TULIP (Screw) ×18 IMPLANT
SCREW RELINE-O POLY 7.5X40 (Screw) ×6 IMPLANT
SCREW RELINE-O POLY 7.5X45 (Screw) ×12 IMPLANT
SPONGE LAP 4X18 RFD (DISPOSABLE) IMPLANT
SPONGE SURGIFOAM ABS GEL 100 (HEMOSTASIS) ×3 IMPLANT
STAPLER SKIN PROX WIDE 3.9 (STAPLE) ×3 IMPLANT
SUT VIC AB 1 CT1 18XBRD ANBCTR (SUTURE) ×2 IMPLANT
SUT VIC AB 1 CT1 8-18 (SUTURE) ×4
SUT VIC AB 2-0 CT1 18 (SUTURE) ×6 IMPLANT
SUT VIC AB 3-0 SH 8-18 (SUTURE) ×6 IMPLANT
SYR 5ML LL (SYRINGE) IMPLANT
TOWEL GREEN STERILE (TOWEL DISPOSABLE) ×3 IMPLANT
TOWEL GREEN STERILE FF (TOWEL DISPOSABLE) ×3 IMPLANT
TRAY FOLEY MTR SLVR 16FR STAT (SET/KITS/TRAYS/PACK) ×3 IMPLANT
WATER STERILE IRR 1000ML POUR (IV SOLUTION) ×3 IMPLANT

## 2018-09-16 NOTE — OR Nursing (Signed)
Called Stan in pharmacy to verify 3gms ancef ok to give in OR.  3 grams ordered by Dr Vertell Limber.  Patient's weight 117.2kg.  Ok to give 3 gms Ancef per Cherlynn Kaiser in pharmacy.  Verified via telephone.

## 2018-09-16 NOTE — Op Note (Signed)
09/16/2018  1:47 PM  PATIENT:  Steve Parker  53 y.o. male  PRE-OPERATIVE DIAGNOSIS:  Spondylolisthesis, Lumbar region L 45, herniated lumbar disc, lumbar foraminal stenosis, L 45 and L 5 S 1 levels with lumbago and radiculopathy  POST-OPERATIVE DIAGNOSIS: Spondylolisthesis, Lumbar region L 45, herniated lumbar disc, lumbar foraminal stenosis, L 45 and L 5 S 1 levels with lumbago and radiculopathy  PROCEDURE:  Procedure(s): Lumbar four-five, Lumbar five Sacral one Posterior lumbar interbody fusion with exploration of Lumbar three-four Fusion (N/A) with PEEK cages, autograft, pedicle screw fixation L 4 - S 1 levels with posterolateral arthrodesis with autograft  SURGEON:  Surgeon(s) and Role:    Erline Levine, MD - Primary    Earnie Larsson, MD - Assisting  PHYSICIAN ASSISTANT:   ASSISTANTS: Poteat, RN   ANESTHESIA:   general  EBL:  300 mL   BLOOD ADMINISTERED:none  DRAINS: (Medium) Hemovact drain(s) in the epidural space with  Suction Open   LOCAL MEDICATIONS USED:  MARCAINE    and LIDOCAINE   SPECIMEN:  No Specimen  DISPOSITION OF SPECIMEN:  N/A  COUNTS:  YES  TOURNIQUET:  * No tourniquets in log *  DICTATION: Patient is 53 year old man with lumbar stenosis and spondylolisthesis and previous fusion L 34 level with stenosis at  L  45 and L 5 S 1 with severe spinal stenosis and a long history of severe back and bilateral leg pain.  It was elected to take him to surgery for exploration of previous fusion with decompression and fusion from L 4  through S 1 levels.   Procedure: Patient was placed in a prone position on the Meta table after smooth and uncomplicated induction of general endotracheal anesthesia. His low back was prepped and draped in usual sterile fashion with betadine scrub and DuraPrep. Area of incision was infiltrated with local lidocaine. Incision was made to the lumbodorsal fascia was incised and exposure was performed of the L 3 - S 1 spinous processes  laminae facet joint and transverse processes. Previous hardware was exposed.  This was covered with dense bone growth and required a chisel to remove the bone to expose the previously placed hardware. The bone scres were removed and appeared to be solidly in bone and there was dense bridging bone across the L 34 level.  Intraoperative x-ray was obtained which confirmed correct orientation with marker probes at L4  Through S 1 levels. A total laminectomy of L 4 through S 1  levels was performed with disarticulation of the facet joints and thorough decompression was performed of both L4 , L 5 , S 1  nerve roots along with the common dural tube. Decompression was greater than would be typical for PLIF. A thorough discectomy with removal  Of spurs and herniated disc material was performed at L 45 and L 5 S 1 levels. A thorough discectomy was then performed on the left with preparation of the endplates for grafting a trial spacer was placed this level and a thorough discectomy was performed on the right as well at L 45.  After trial sizing and utilization of sequential shavers, interspaces were packed with autograft and 25mm  X 23 x 12 degree lordotic PEEK cages with 20 cc of autograft in the intrerspace.  8 mm x 23 x 12 degree cages were placed at L 5 S 1 after thorough decompression at this level with 15 cc autograft at this level . There was good correction of foraminal stenosis. The posterolateral  region was extensively decorticated and pedicle probes were placed at L 4  through S 1 bilaterally. Intraoperative fluoroscopy confirmed correct orientationin the AP and lateral plane. 45 x 7.5 mm pedicle screws were placed at L 4 bilaterally and 45 x 7.5 mm screws placed at L 5 bilaterally  And 40 x 7.5 mm screws at S 1 bilaterally.   Final x-rays demonstrated well-positioned interbody grafts and pedicle screw fixation. A 70 mm lordotic rod was placed on the right and a 70 mm rod was placed on the left locked down in situ  and the posterolateral region was packed with 30 cc bone graft on the right and  A similar amount on the left. The wound was irrigated. A medium Hemovac drain was placed. Fascia was closed with 1 Vicryl sutures skin edges were reapproximated 2 and 3-0 Vicryl sutures. The wound was dressed with Dermabond and  an occlusive dressing the patient was extubated in the operating room and taken to recovery in stable satisfactory condition she tolerated traction well counts were correct at the end of the case.   PLAN OF CARE: Admit to inpatient   PATIENT DISPOSITION:  PACU - hemodynamically stable.   Delay start of Pharmacological VTE agent (>24hrs) due to surgical blood loss or risk of bleeding: yes  Pelvic Parameters:  PT   25  PI   55 LL   -37 PI-LL   +18

## 2018-09-16 NOTE — H&P (Signed)
Patient ID:   [id number] Patient: Steve Parker  Date of Birth: 1965-08-10 Visit Type: Chart Update   Date: 07/07/2018 02:15 PM Provider: Marchia Meiers. Vertell Limber MD   This 53 year old male presents for back pain.  HISTORY OF PRESENT ILLNESS: 1.  back pain  I discussed the patient's condition and my recommendations for surgery with his insurance reviewer.  She indicated that there was no discussion of conservative management and I explained that the imaging findings showed a significant mobile spondylolisthesis of L4 on L5 with severe left sided nerve compression.  This was identified on a supine MRI with 5 mm of anterolisthesis however this increases to 11 mm on standing and decreases to 10 mm with motion.  The patient continues to demonstrate significant left leg weakness.  The imaging findings at L5-S1 are not as severe but there appears to be nerve irritation bilaterally.  I have discussed weight loss and physical conditioning with the patient but I do not believe these are short-term goals but are more long-term objectives.  The patient did not want to pursue injections out of concern for raising his blood sugars and I do not think that physical therapy will be of benefit to the patient.  I continue to recommend proceeding with surgical decompression and stabilization.  The patient has tried nonsteroidal anti-inflammatories but says these upset his stomach and he does not tolerate them well.  I am concerned that leaving his significant left leg weakness untreated will make this a permanent deficit and would strongly advise proceeding with surgical intervention.        MEDICATIONS: (added, continued or stopped this visit) Started Medication Directions Instruction Stopped  amlodipine 5 mg tablet take 1 tablet by oral route  every day    glyburide 5 mg tablet take 1 tablet by oral route  every 2 days before breakfast    Levemir FlexTouch U-100 Insulin 100 unit/mL (3 mL) subcutaneous  pen inject 25 units by subcutaneous route  every day at bedtime    lisinopril 40 mg tablet take 1 tablet by oral route  every day    metformin 1,000 mg tablet take 1 tablet by oral route 2 times every day with morning and evening meals   06/18/2018 tramadol 50 mg tablet take 1 tablet by oral route qid as needed      ALLERGIES: Ingredient Reaction Medication Name Comment NO KNOWN ALLERGIES    No known allergies.                  Provider:  Marchia Meiers. Vertell Limber MD  07/07/2018 02:22 PM    Dictation edited by: Marchia Meiers. Vertell Limber    CC Providers: [place]  [phone]   [facility]  [address] [place], [place] [address]               Electronically signed by Marchia Meiers. Vertell Limber MD on 07/17/2018 12:55 PM      Patient ID:   [id number] Patient: Steve Parker  Date of Birth: 1965-12-18 Visit Type: Office Visit   Date: 06/16/2018 08:45 AM Provider: Marchia Meiers. Vertell Limber MD   This 53 year old male presents for back pain.  HISTORY OF PRESENT ILLNESS: 1.  back pain  Patient returns to review his MRI. Patient endorses unchanged bilateral low back pain, right-sided groin pain, and burning left lower extremity pain to his shin. Patient notes that his pain is worse in the morning and  rates his current pain at 6-8/10.  06/12/18 L-spine MRI with and without contrast Good appearance at the previous discectomy, decompression and fusion level of L3-4. L4-5: Advanced bilateral facet arthropathy with 5 mm of anterolisthesis in this position. This is known to worsen with standing. Broad-based disc herniation more prominent in the left posterolateral direction. Lateral recess and foraminal stenosis left worse than right likely to cause neural compression, particularly on the left. L5-S1: Small central disc herniation contacts the thecal sac and both S1 nerves. Neural compression is not demonstrated, but  neural irritation could occur. Foraminal stenosis on the right of a mild degree as well. L2-3: Mild bilateral lateral recess narrowing without visible neural compression. L1-2: Shallow disc protrusion with slight caudal down turning behind the superior endplate of L1 and a small amount of caudally migrated disc material in the right lateral recess. No apparent neural compressive stenosis however.      Medical/Surgical/Interim History Reviewed, no change.  Last detailed document date:05/26/2018.     Family History: Reviewed, no changes.  Last detailed document date:05/26/2018.   Social History: Reviewed, no changes. Last detailed document date: 05/26/2018.    MEDICATIONS: (added, continued or stopped this visit) Started Medication Directions Instruction Stopped  amlodipine 5 mg tablet take 1 tablet by oral route  every day    glyburide 5 mg tablet take 1 tablet by oral route  every 2 days before breakfast    Levemir FlexTouch U-100 Insulin 100 unit/mL (3 mL) subcutaneous pen inject 25 units by subcutaneous route  every day at bedtime    lisinopril 40 mg tablet take 1 tablet by oral route  every day    metformin 1,000 mg tablet take 1 tablet by oral route 2 times every day with morning and evening meals      ALLERGIES:    PHYSICAL EXAM:  Vitals Date Temp F BP Pulse Ht In Wt Lb BMI BSA Pain Score 06/16/2018  182/83 99 73 277 36.55  6/10     IMPRESSION:  L-spine MRI without contrast reveals leftward broad-based disc herniation at L4-5 with left greater than right foraminal and lateral recess stenosis, neural compression is likely at this level. L5-S1 central disc protrusion contacts thecal sac and bilateral S1 nerve roots without significant compression. Shallow disc protrusion at L1-2 with slight caudal down turning behind the superior endplate of L1 and a small amount of caudally migrated disc material in the right lateral recess - no  significant compression. Well-healed PLIF at L3-4 level. Advised patient that the most severe findings are at the L4-5 level.  Upon examination, 4/5 left EHL, 4/5 left dorsiflexion, findings are unchanged from previous examination.  Recommended surgical intervention. Advised patient that surgery could be performed at only the L4-5 level but such a surgery would potentially allow for the development of further problems at the L5-S1 level. Recommended L4-5 and L5-S1 PLIF with exploration of L3-4 fusion. Discussed injections - advised patient that if injections provide significant relief of his symptoms then it is possible to postpone surgery. However, also discussed with patient that injections are unlikely to provide significant relief given his structural problems. Patient did not wish to proceed with any injections. Patient wished to proceed with L4-5, L5-S1 PLIF with exploration of L3-4 fusion. Encouraged weight loss and discussed patient's weight goal - patient's current weight goal is approximately 200 lbs. Patient will follow-up after surgery is complete.  PLAN: 1. Nurse education given 2. Rx LSO brace 3. L4-5, L5-S1 PLIF with exploration of  L3-4 fusion scheduled 4. Follow-up after surgery with X-rays  Orders: Diagnostic Procedures: Assessment Procedure M54.16 Lumbar Spine- AP/Lat  Assessment/Plan  # Detail Type Description  1. Assessment Spondylolisthesis, lumbar region (M43.16).     2. Assessment Lumbar radiculopathy (M54.16).       Pain Management Plan Location: back. Onset: 12/24/2017. Duration: varies. Quality: discomforting. Pain management follow-up plan of care: Patient alternating heat and ice.              Provider:  Marchia Meiers. Vertell Limber MD  06/16/2018 09:02 PM Dictation edited by: Mirian Mo    CC Providers: [place]  [phone]   [facility]  [address] [place], [place]  [address]               Electronically signed by Marchia Meiers. Vertell Limber MD on 06/21/2018 01:52 PM

## 2018-09-16 NOTE — Transfer of Care (Signed)
Immediate Anesthesia Transfer of Care Note  Patient: Steve Parker  Procedure(s) Performed: Lumbar four-five, Lumbar five Sacral one Posterior lumbar interbody fusion with exploration of Lumbar three-four Fusion (N/A Back)  Patient Location: PACU  Anesthesia Type:General  Level of Consciousness: drowsy  Airway & Oxygen Therapy: Patient Spontanous Breathing and Patient connected to nasal cannula oxygen  Post-op Assessment: Report given to RN, Post -op Vital signs reviewed and stable and Patient moving all extremities  Post vital signs: Reviewed and stable  Last Vitals:  Vitals Value Taken Time  BP 134/87 09/16/2018  1:42 PM  Temp 36.6 C 09/16/2018  1:38 PM  Pulse 75 09/16/2018  1:43 PM  Resp 7 09/16/2018  1:43 PM  SpO2 98 % 09/16/2018  1:43 PM  Vitals shown include unvalidated device data.  Last Pain:  Vitals:   09/16/18 0728  TempSrc: Oral  PainSc: 2       Patients Stated Pain Goal: 2 (32/91/91 [id number])  Complications: No apparent anesthesia complications

## 2018-09-16 NOTE — Progress Notes (Signed)
Received patient from PACU s/p PLIF, pain well controlled upon arrival, pt ambulated from stretcher to bed, developed nausea zofran given.  Patient oriented to room, call bell placed within reach, bed rails up  X3.  Hemovac in place draining dark red blood, will continue to monitor closely.  Refer to Epic for detailed assessment.

## 2018-09-16 NOTE — Progress Notes (Signed)
Awake, alert, conversant.  Full strength bilateral LEs, no numbness.  Sore in back.  Doing well.

## 2018-09-16 NOTE — Interval H&P Note (Signed)
History and Physical Interval Note:  09/16/2018 7:35 AM  Steve Parker  has presented today for surgery, with the diagnosis of Spondylolisthesis, Lumbar region.  The various methods of treatment have been discussed with the patient and family. After consideration of risks, benefits and other options for treatment, the patient has consented to  Procedure(s) with comments: Lumbar 4-5, Lumbar 5 Sacral 1 Posterior lumbar interbody fusion with exploration of Lumbar 3-4 Fusion (N/A) - Lumbar 4-5, Lumbar 5 Sacral 1 Posterior lumbar interbody fusion with exploration of Lumbar 3-4 Fusion as a surgical intervention.  The patient's history has been reviewed, patient examined, no change in status, stable for surgery.  I have reviewed the patient's chart and labs.  Questions were answered to the patient's satisfaction.     [name]

## 2018-09-16 NOTE — Progress Notes (Signed)
Orthopedic Tech Progress Note Patient Details:  Steve Parker 02/15/1966 [id number] Patient was supplied with brace before surgery Patient ID: Orpah Cobb, male   DOB: 19-Feb-1966, 53 y.o.   MRN: [id number]   Janit Pagan 09/16/2018, 3:00 PM

## 2018-09-16 NOTE — Anesthesia Procedure Notes (Signed)
Procedure Name: Intubation Date/Time: 09/16/2018 10:18 AM Performed by: Asaiah Hunnicutt T, CRNA Pre-anesthesia Checklist: Patient identified, Emergency Drugs available, Suction available and Patient being monitored Patient Re-evaluated:Patient Re-evaluated prior to induction Oxygen Delivery Method: Circle system utilized Preoxygenation: Pre-oxygenation with 100% oxygen Induction Type: IV induction and Rapid sequence Laryngoscope Size: Miller and 3 Grade View: Grade II Tube type: Oral Tube size: 7.5 mm Number of attempts: 1 Airway Equipment and Method: Patient positioned with wedge pillow and Stylet Placement Confirmation: ETT inserted through vocal cords under direct vision,  positive ETCO2 and breath sounds checked- equal and bilateral Secured at: 23 cm Tube secured with: Tape Dental Injury: Teeth and Oropharynx as per pre-operative assessment

## 2018-09-16 NOTE — Brief Op Note (Signed)
09/16/2018  1:47 PM  PATIENT:  Steve Parker  53 y.o. male  PRE-OPERATIVE DIAGNOSIS:  Spondylolisthesis, Lumbar region L 45, herniated lumbar disc, lumbar foraminal stenosis, L 45 and L 5 S 1 levels with lumbago and radiculopathy  POST-OPERATIVE DIAGNOSIS: Spondylolisthesis, Lumbar region L 45, herniated lumbar disc, lumbar foraminal stenosis, L 45 and L 5 S 1 levels with lumbago and radiculopathy  PROCEDURE:  Procedure(s): Lumbar four-five, Lumbar five Sacral one Posterior lumbar interbody fusion with exploration of Lumbar three-four Fusion (N/A) with PEEK cages, autograft, pedicle screw fixation L 4 - S 1 levels with posterolateral arthrodesis with autograft  SURGEON:  Surgeon(s) and Role:    Erline Levine, MD - Primary    Earnie Larsson, MD - Assisting  PHYSICIAN ASSISTANT:   ASSISTANTS: P[place], RN   ANESTHESIA:   general  EBL:  300 mL   BLOOD ADMINISTERED:none  DRAINS: (Medium) Hemovact drain(s) in the epidural space with  Suction Open   LOCAL MEDICATIONS USED:  MARCAINE    and LIDOCAINE   SPECIMEN:  No Specimen  DISPOSITION OF SPECIMEN:  N/A  COUNTS:  YES  TOURNIQUET:  * No tourniquets in log *  DICTATION: Patient is 53 year old man with lumbar stenosis and spondylolisthesis and previous fusion L 34 level with stenosis at  L  45 and L 5 S 1 with severe spinal stenosis and a long history of severe back and bilateral leg pain.  It was elected to take him to surgery for exploration of previous fusion with decompression and fusion from L 4  through S 1 levels.   Procedure: Patient was placed in a prone position on the [place] table after smooth and uncomplicated induction of general endotracheal anesthesia. His low back was prepped and draped in usual sterile fashion with betadine scrub and DuraPrep. Area of incision was infiltrated with local lidocaine. Incision was made to the lumbodorsal fascia was incised and exposure was performed of the L 3 - S 1 spinous processes  laminae facet joint and transverse processes. Previous hardware was exposed.  This was covered with dense bone growth and required a chisel to remove the bone to expose the previously placed hardware. The bone scres were removed and appeared to be solidly in bone and there was dense bridging bone across the L 34 level.  Intraoperative x-ray was obtained which confirmed correct orientation with marker probes at L4  Through S 1 levels. A total laminectomy of L 4 through S 1  levels was performed with disarticulation of the facet joints and thorough decompression was performed of both L4 , L 5 , S 1  nerve roots along with the common dural tube. Decompression was greater than would be typical for PLIF. A thorough discectomy with removal  Of spurs and herniated disc material was performed at L 45 and L 5 S 1 levels. A thorough discectomy was then performed on the left with preparation of the endplates for grafting a trial spacer was placed this level and a thorough discectomy was performed on the right as well at L 45.  After trial sizing and utilization of sequential shavers, interspaces were packed with autograft and 48mm  X 23 x 12 degree lordotic PEEK cages with 20 cc of autograft in the intrerspace.  8 mm x 23 x 12 degree cages were placed at L 5 S 1 after thorough decompression at this level with 15 cc autograft at this level . There was good correction of foraminal stenosis. The posterolateral  region was extensively decorticated and pedicle probes were placed at L 4  through S 1 bilaterally. Intraoperative fluoroscopy confirmed correct orientationin the AP and lateral plane. 45 x 7.5 mm pedicle screws were placed at L 4 bilaterally and 45 x 7.5 mm screws placed at L 5 bilaterally  And 40 x 7.5 mm screws at S 1 bilaterally.   Final x-rays demonstrated well-positioned interbody grafts and pedicle screw fixation. A 70 mm lordotic rod was placed on the right and a 70 mm rod was placed on the left locked down in situ  and the posterolateral region was packed with 30 cc bone graft on the right and  A similar amount on the left. The wound was irrigated. A medium Hemovac drain was placed. Fascia was closed with 1 Vicryl sutures skin edges were reapproximated 2 and 3-0 Vicryl sutures. The wound was dressed with Dermabond and  an occlusive dressing the patient was extubated in the operating room and taken to recovery in stable satisfactory condition she tolerated traction well counts were correct at the end of the case.   PLAN OF CARE: Admit to inpatient   PATIENT DISPOSITION:  PACU - hemodynamically stable.   Delay start of Pharmacological VTE agent (>24hrs) due to surgical blood loss or risk of bleeding: yes  Pelvic Parameters:  PT   25  PI   55 LL   -37 PI-LL   +18

## 2018-09-17 ENCOUNTER — Encounter (HOSPITAL_COMMUNITY): Payer: Self-pay | Admitting: General Practice

## 2018-09-17 LAB — GLUCOSE, CAPILLARY
Glucose-Capillary: 99 mg/dL (ref 70–99)
Glucose-Capillary: 196 mg/dL — ABNORMAL HIGH (ref 70–99)

## 2018-09-17 MED ORDER — OXYCODONE HCL 5 MG PO TABS: 5.0000 mg | ORAL_TABLET | ORAL | 0 refills | Status: AC | PRN

## 2018-09-17 MED ORDER — METHOCARBAMOL 500 MG PO TABS: 500.0000 mg | ORAL_TABLET | Freq: Four times a day (QID) | ORAL | 1 refills | Status: AC | PRN

## 2018-09-17 NOTE — Social Work (Signed)
CSW acknowledging consult for SNF placement. Will follow for therapy recommendations. At current time it appears pt is cleared for dc home.   Westley Hummer, MSW, [place] Work [phone]

## 2018-09-17 NOTE — Evaluation (Signed)
Occupational Therapy Evaluation Patient Details Name: Steve Parker MRN: [id number] DOB: Aug 15, 1965 Today's Date: 09/17/2018    History of Present Illness Pt is a 53 y.o. M with significant PMH of previous back surgery who presents s/p L4-5, L5-S1 posterior lumbar interbody fusion and pedicle screw fixation L4-S1 levels with posterolateral arthrodesis.   Clinical Impression   OT eval and education complete regarding back precautions and ADL activity     Follow Up Recommendations  No OT follow up    Equipment Recommendations  None recommended by OT    Recommendations for Other Services       Precautions / Restrictions Precautions Precautions: Back Precaution Booklet Issued: Yes (comment) Precaution Comments: verbally reviewed and provided written handout Required Braces or Orthoses: Spinal Brace Spinal Brace: Lumbar corset;Applied in sitting position Restrictions Weight Bearing Restrictions: No      Mobility Bed Mobility Overal bed mobility: Modified Independent             General bed mobility comments: cues for log roll technique, HOB up  Transfers Overall transfer level: Modified independent Equipment used: None                  Balance Overall balance assessment: Mild deficits observed, not formally tested                                         ADL either performed or assessed with clinical judgement   ADL Overall ADL's : Needs assistance/impaired             Lower Body Bathing: Moderate assistance;Sit to/from stand;Cueing for sequencing;Cueing for safety;Adhering to back precautions   Upper Body Dressing : Set up;Sitting   Lower Body Dressing: Moderate assistance;Sit to/from stand;Cueing for sequencing;Cueing for safety;With adaptive equipment   Toilet Transfer: Minimal assistance;Ambulation;Comfort height toilet;Grab bars   Toileting- Clothing Manipulation and Hygiene: Minimal assistance;Sit to/from stand;Cueing for back  precautions;Cueing for safety;Cueing for sequencing     Tub/Shower Transfer Details (indicate cue type and reason): pt only performs sponge bath Functional mobility during ADLs: Minimal assistance;Cueing for safety;Cueing for sequencing General ADL Comments: Education regarding back precautions and ADL activity completed. Wife will A with all ADL's. Pt has AE                  Pertinent Vitals/Pain Faces Pain Scale: Hurts a little bit Pain Location: surgical site Pain Descriptors / Indicators: Operative site guarding;Sore Pain Intervention(s): Limited activity within patient's tolerance;Monitored during session        Extremity/Trunk Assessment         Cervical / Trunk Assessment Cervical / Trunk Assessment: Other exceptions Cervical / Trunk Exceptions: s/p PLIF   Communication Communication Communication: No difficulties   Cognition Arousal/Alertness: Awake/alert Behavior During Therapy: WFL for tasks assessed/performed Overall Cognitive Status: Within Functional Limits for tasks assessed                                                Home Living Family/patient expects to be discharged to:: Private residence Living Arrangements: Alone Available Help at Discharge: Family;Available PRN/intermittently(mom, dad, sister) Type of Home: Mobile home Home Access: Stairs to enter Entrance Stairs-Number of Steps: 2   Home Layout: One level     Bathroom Shower/Tub: Walk-in shower  Home Equipment: [place] - 2 wheels;Bedside commode;Adaptive equipment Adaptive Equipment: Sock aid        Prior Functioning/Environment Level of Independence: Independent        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[employer] goals can be found in the care plan section) Acute Rehab OT Goals Patient Stated Goal: "walk on my treadmill when I can." OT Goal Formulation: With patient  OT Frequency:      AM-PAC OT "6 Clicks" Daily  Activity     Outcome Measure Help from another person eating meals?: None Help from another person taking care of personal grooming?: None Help from another person toileting, which includes using toliet, bedpan, or urinal?: A Little Help from another person bathing (including washing, rinsing, drying)?: A Little Help from another person to put on and taking off regular upper body clothing?: None Help from another person to put on and taking off regular lower body clothing?: A Little 6 Click Score: 21   End of Session Equipment Utilized During Treatment: Back brace Nurse Communication: Mobility status  Activity Tolerance: Patient tolerated treatment well Patient left: in bed;with call bell/phone within reach                   Time: 1215-1232 OT Time Calculation (min): 17 min Charges:  OT General Charges $OT Visit: 1 Visit OT Evaluation $OT Eval Low Complexity: 1 Low  Steve Parker, OT Acute Rehabilitation Services [phone] Office- [phone]     Steve Parker, Edwena Felty D 09/17/2018, 1:53 PM

## 2018-09-17 NOTE — Progress Notes (Addendum)
Subjective: Patient reports "I feel better, not a lot of pain this morning"  Objective: Vital signs in last 24 hours: Temp:  [97.2 F (36.2 C)-99.3 F (37.4 C)] 99.3 F (37.4 C) (06/03 0746) Pulse Rate:  [63-84] 71 (06/03 0746) Resp:  [12-20] 18 (06/02 1711) BP: (107-138)/(61-98) 122/61 (06/03 0746) SpO2:  [90 %-99 %] 92 % (06/03 0746)  Intake/Output from previous day: 06/02 0701 - 06/03 0700 In: 2670 [P.O.:170; I.V.:2300; IV Piggyback:200] Out: 2925 [Urine:2200; Drains:425; Blood:300] Intake/Output this shift: No intake/output data recorded.  Alert, conversant. Good strength BLE. Denies leg pain/numbness, noting mild incisional pain with position changes. Ambulated last night. Incision flat without erythema or drainage beneath honeycomb and Dermabond. Hemovac 233ml overnight. Minimal dark blood in drain at present.  Lab Results: No results for input(s): WBC, HGB, HCT, PLT in the last 72 hours. BMET No results for input(s): NA, K, CL, CO2, GLUCOSE, BUN, CREATININE, CALCIUM in the last 72 hours.  Studies/Results: Dg Lumbar Spine 2-3 Views  Result Date: 09/16/2018 CLINICAL DATA:  Intraoperative imaging for L4-S1 laminectomy and fusion. EXAM: DG C-ARM 61-120 MIN; LUMBAR SPINE - 2-3 VIEW COMPARISON:  MRI lumbar spine 06/12/2018. FINDINGS: Three fluoroscopic spot views of the lower lumbar spine are provided. Images demonstrate pedicle screws and interbody spacers in place from L4-S1. Interbody spacer from prior L3-4 fusion is noted. No acute abnormality is identified. IMPRESSION: L4-S1 fusion in progress. Electronically Signed   By: Inge Rise M.D.   On: 09/16/2018 13:23   Dg C-arm 1-60 Min  Result Date: 09/16/2018 CLINICAL DATA:  Intraoperative imaging for L4-S1 laminectomy and fusion. EXAM: DG C-ARM 61-120 MIN; LUMBAR SPINE - 2-3 VIEW COMPARISON:  MRI lumbar spine 06/12/2018. FINDINGS: Three fluoroscopic spot views of the lower lumbar spine are provided. Images demonstrate pedicle  screws and interbody spacers in place from L4-S1. Interbody spacer from prior L3-4 fusion is noted. No acute abnormality is identified. IMPRESSION: L4-S1 fusion in progress. Electronically Signed   By: Inge Rise M.D.   On: 09/16/2018 13:23    Assessment/Plan: improving  LOS: 1 day  Work with PT this am. Will leave drain in place this am, plan to pull if no more than 44ml by noon, and likely home this afternoon. Pt agreeable with plan. Pain well controlled with Norco 5/325 and Robaxin 500mg .   Verdis Prime 09/17/2018, 7:57 AM  Patient is progressing well.  Drain out later today.  Mobilize in brace as tolerated.

## 2018-09-17 NOTE — Evaluation (Signed)
Physical Therapy Evaluation and Discharge Patient Details Name: Steve Parker MRN: [id number] DOB: [birth year] Today's Date: 09/17/2018   History of Present Illness  Pt is a 53 y.o. M with significant PMH of previous back surgery who presents s/p L4-5, L5-S1 posterior lumbar interbody fusion and pedicle screw fixation L4-S1 levels with posterolateral arthrodesis.  Clinical Impression  Patient evaluated by Physical Therapy with no further acute PT needs identified. Pt reporting minimal pain at surgical site with movement, states he notes improvement in his right lateral shift. Pt ambulating 200 feet with no assistive device without physical assistance. Able to negotiate 2 steps to simulate home set up. Education re: spinal precautions, generalized walking program, activity progression, work modification, cryotherapy. Pt All education has been completed and the patient has no further questions. No follow-up Physical Therapy or equipment needs. PT is signing off. Thank you for this referral.     Follow Up Recommendations No PT follow up    Equipment Recommendations  None recommended by PT    Recommendations for Other Services       Precautions / Restrictions Precautions Precautions: Back Precaution Booklet Issued: Yes (comment) Precaution Comments: verbally reviewed and provided written handout Required Braces or Orthoses: Spinal Brace Spinal Brace: Lumbar corset;Applied in sitting position Restrictions Weight Bearing Restrictions: No      Mobility  Bed Mobility Overal bed mobility: Modified Independent             General bed mobility comments: cues for log roll technique, HOB up  Transfers Overall transfer level: Modified independent Equipment used: None                Ambulation/Gait Ambulation/Gait assistance: Supervision Gait Distance (Feet): 200 Feet Assistive device: None Gait Pattern/deviations: Step-through pattern;Decreased stride length Gait velocity:  decreased   General Gait Details: Pt with decreased reciprocal arm swing, mild dynamic balance deficits noted but no overt LOB. Right lateral shift noted but pt reports it has improved s/p surgery.  Stairs Stairs: Yes Stairs assistance: Supervision Stair Management: One rail Left Number of Stairs: 2 General stair comments: cues for step by step pattern  Wheelchair Mobility    Modified Rankin (Stroke Patients Only)       Balance Overall balance assessment: Mild deficits observed, not formally tested                                           Pertinent Vitals/Pain Pain Assessment: Faces Faces Pain Scale: Hurts a little bit Pain Location: surgical site Pain Descriptors / Indicators: Operative site guarding;Sore Pain Intervention(s): Monitored during session    Home Living Family/patient expects to be discharged to:: Private residence Living Arrangements: Alone Available Help at Discharge: Family;Available PRN/intermittently(mom, dad, sister) Type of Home: Mobile home Home Access: Stairs to enter   Entrance Stairs-Number of Steps: 2 Home Layout: One level Home Equipment: [place] - 2 wheels;Bedside commode;Adaptive equipment      Prior Function Level of Independence: Independent         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        Extremity/Trunk Assessment   Upper Extremity Assessment Upper Extremity Assessment: Overall WFL for tasks assessed    Lower Extremity Assessment Lower Extremity Assessment: Overall WFL for tasks assessed    Cervical / Trunk Assessment Cervical / Trunk Assessment: Other exceptions Cervical / Trunk Exceptions: s/p PLIF  Communication   Communication: No difficulties  Cognition Arousal/Alertness: Awake/alert Behavior During Therapy: WFL for tasks assessed/performed Overall Cognitive Status: Within Functional Limits for tasks assessed                                         General Comments      Exercises     Assessment/Plan    PT Assessment Patent does not need any further PT services  PT Problem List Decreased activity tolerance;Decreased balance;Decreased mobility;Pain       PT Treatment Interventions      PT Goals (Current goals can be found in the Care Plan section)  Acute Rehab PT Goals Patient Stated Goal: "walk on my treadmill when I can." PT Goal Formulation: With patient Time For Goal Achievement: 10/01/18 Potential to Achieve Goals: Good    Frequency     Barriers to discharge        Co-evaluation               AM-PAC PT "6 Clicks" Mobility  Outcome Measure Help needed turning from your back to your side while in a flat bed without using bedrails?: None Help needed moving from lying on your back to sitting on the side of a flat bed without using bedrails?: None Help needed moving to and from a bed to a chair (including a wheelchair)?: None Help needed standing up from a chair using your arms (e.g., wheelchair or bedside chair)?: None Help needed to walk in hospital room?: None Help needed climbing 3-5 steps with a railing? : None 6 Click Score: 24    End of Session Equipment Utilized During Treatment: Back brace Activity Tolerance: Patient tolerated treatment well Patient left: in chair;with call bell/phone within reach Nurse Communication: Mobility status PT Visit Diagnosis: Unsteadiness on feet (R26.81);Pain Pain - part of body: (back)    Time: 2119-4174 PT Time Calculation (min) (ACUTE ONLY): 28 min   Charges:   PT Evaluation $PT Eval Low Complexity: 1 Low PT Treatments $Self Care/Home Management: 8-22        Ellamae Sia, PT, DPT Acute Rehabilitation Services Pager [phone] Office [phone]   Willy Eddy 09/17/2018, 10:06 AM

## 2018-09-17 NOTE — Discharge Summary (Signed)
Physician Discharge Summary  Patient ID: Steve Parker MRN: [id number] DOB/AGE: 1966/02/16 53 y.o.  Admit date: 09/16/2018 Discharge date: 09/17/2018  Admission Diagnoses: Spondylolisthesis, Lumbar region L 45, herniated lumbar disc, lumbar foraminal stenosis, L 45 and L 5 S 1 levels with lumbago and radiculopathy    Discharge Diagnoses: Spondylolisthesis, Lumbar region L 45, herniated lumbar disc, lumbar foraminal stenosis, L 45 and L 5 S 1 levels with lumbago and radiculopathy s/p Lumbar four-five, Lumbar five Sacral one Posterior lumbar interbody fusion with exploration of Lumbar three-four Fusion (N/A) with PEEK cages, autograft, pedicle screw fixation L 4 - S 1 levels with posterolateral arthrodesis with autograft     Active Problems:   Spondylolisthesis of lumbar region   Discharged Condition: good  Hospital Course: Steve Parker was admitted for surgery with lumbar spondylolisthesis and radiculopathy. Following uncomplicated surgery (as above), he recovered well and transferred to [facility] for nursing care and therapies. He is mobilizing well.   Consults: None  Significant Diagnostic Studies: radiology: X-Ray: intra-operative  Treatments: surgery: Lumbar four-five, Lumbar five Sacral one Posterior lumbar interbody fusion with exploration of Lumbar three-four Fusion (N/A) with PEEK cages, autograft, pedicle screw fixation L 4 - S 1 levels with posterolateral arthrodesis with autograft    Discharge Exam: Blood pressure 122/61, pulse 71, temperature 99.3 F (37.4 C), temperature source Oral, resp. rate 18, height 6\' 1"  (1.854 m), weight 117.2 kg, SpO2 92 %. Alert, conversant. Good strength BLE. Denies leg pain/numbness, noting mild incisional pain with position changes. Ambulated last night. Incision flat without erythema or drainage beneath honeycomb and Dermabond. Hemovac 216ml overnight. Minimal dark blood in drain at present- will d/c today and d/c to home if no  significant increase.    Disposition: Discharge to home. Norco 5/325 and Robaxin 500mg  for prn home use will be eRx'ed from office to his pharmacy. Office f/u with x-rays in 3-4 weeks. He verbalizes understanding of d/c instructions.   Allergies as of 09/17/2018      Reactions   Niacin Other (See Comments)   Lightheaded, fatigue and hot flashes      Medication List    TAKE these medications   amLODipine 5 MG tablet Commonly known as:  NORVASC Take 5 mg by mouth daily.   glyBURIDE 5 MG tablet Commonly known as:  DIABETA Take 10 mg by mouth 2 (two) times daily.   Levemir FlexTouch 100 UNIT/ML Pen Generic drug:  Insulin Detemir Inject 30 Units into the skin at bedtime.   lisinopril 40 MG tablet Commonly known as:  ZESTRIL Take 40 mg by mouth daily.   metFORMIN 1000 MG tablet Commonly known as:  GLUCOPHAGE Take 1,000 mg by mouth 2 (two) times daily with a meal.   methocarbamol 500 MG tablet Commonly known as:  ROBAXIN Take 1 tablet (500 mg total) by mouth every 6 (six) hours as needed for muscle spasms.   oxyCODONE 5 MG immediate release tablet Commonly known as:  Oxy IR/ROXICODONE Take 1-2 tablets (5-10 mg total) by mouth every 3 (three) hours as needed for moderate pain ((score 4 to 6)).   traMADol 50 MG tablet Commonly known as:  ULTRAM Take 50 mg by mouth every 6 (six) hours as needed for moderate pain.        Signed: [name], MD 09/17/2018, 8:23 AM

## 2018-09-18 ENCOUNTER — Encounter (HOSPITAL_COMMUNITY): Payer: Self-pay | Admitting: Neurosurgery

## 2018-09-18 MED FILL — Thrombin For Soln 5000 Unit: CUTANEOUS | Qty: 5000 | Status: AC

## 2018-09-18 NOTE — Anesthesia Postprocedure Evaluation (Signed)
Anesthesia Post Note  Patient: Steve Parker  Procedure(s) Performed: Lumbar four-five, Lumbar five Sacral one Posterior lumbar interbody fusion with exploration of Lumbar three-four Fusion (N/A Back)     Patient location during evaluation: PACU Anesthesia Type: General Level of consciousness: awake and alert Pain management: pain level controlled Vital Signs Assessment: post-procedure vital signs reviewed and stable Respiratory status: spontaneous breathing, nonlabored ventilation, respiratory function stable and patient connected to nasal cannula oxygen Cardiovascular status: blood pressure returned to baseline and stable Postop Assessment: no apparent nausea or vomiting Anesthetic complications: no    Last Vitals:  Vitals:   09/17/18 0746 09/17/18 1141  BP: 122/61 (!) 94/52  Pulse: 71 71  Resp:  16  Temp: 37.4 C 36.8 C  SpO2: 92% 98%    Last Pain:  Vitals:   09/17/18 1141  TempSrc: Oral  PainSc:                  Vaudie Engebretsen

## 2018-09-22 MED FILL — Sodium Chloride IV Soln 0.9%: INTRAVENOUS | Qty: 1000 | Status: AC

## 2018-09-22 MED FILL — Heparin Sodium (Porcine) Inj 1000 Unit/ML: INTRAMUSCULAR | Qty: 30 | Status: AC

## 2018-09-25 ENCOUNTER — Other Ambulatory Visit (HOSPITAL_COMMUNITY): Payer: Self-pay | Admitting: Neurosurgery

## 2018-09-25 ENCOUNTER — Telehealth (HOSPITAL_COMMUNITY): Payer: Self-pay | Admitting: Rehabilitation

## 2018-09-25 DIAGNOSIS — R2243 Localized swelling, mass and lump, lower limb, bilateral: Secondary | ICD-10-CM

## 2018-09-25 NOTE — Telephone Encounter (Signed)
The above patient or their representative was contacted and gave the following answers to these questions:         Do you have any of the following symptoms? No Fever                    Cough                   Shortness of breath  Do  you have any of the following other symptoms? No  muscle pain         vomiting,        diarrhea        rash         weakness        red eye        abdominal pain         bruising         bleeding              joint pain           severe headache  Have you been in contact with someone who was or has been sick in the past 2 weeks? No Yes                 Unsure                         Unable to assess   Does the person that you were in contact with have any of the following symptoms?  Cough         shortness of breath           muscle pain         vomiting,            diarrhea            rash            weakness           fever            red eye           abdominal pain          bruising  or  bleeding                joint pain                severe headache             Have you  or someone you have been in contact with traveled internationally in the last month?  No      If yes, which countries?  Have you  or someone you have been in contact with traveled outside [place] in the last month?  No      If yes, which state and city?  COMMENTS OR ACTION PLAN FOR THIS PATIENT:    

## 2018-09-26 ENCOUNTER — Other Ambulatory Visit: Payer: Self-pay

## 2018-09-26 ENCOUNTER — Ambulatory Visit (HOSPITAL_COMMUNITY)
Admission: RE | Admit: 2018-09-26 | Discharge: 2018-09-26 | Disposition: A | Discharge status: Home / Self Care | Payer: 59 | Source: Ambulatory Visit | Attending: Family | Admitting: Family

## 2018-09-26 DIAGNOSIS — R2243 Localized swelling, mass and lump, lower limb, bilateral: Secondary | ICD-10-CM | POA: Diagnosis not present

## 2018-09-26 NOTE — Progress Notes (Signed)
BLE venous duplex performed. Preliminary results called in to [facility], [place]. Patient instructed to return home. Preliminary report in Epic.

## 2019-07-16 ENCOUNTER — Inpatient Hospital Stay
Admission: AD | Admit: 2019-07-16 | Discharge: 2019-08-07 | Disposition: A | Discharge status: Rehabilitation Facility | Payer: PRIVATE HEALTH INSURANCE | Source: Other Acute Inpatient Hospital | Attending: Internal Medicine | Admitting: Internal Medicine

## 2019-07-16 ENCOUNTER — Other Ambulatory Visit (HOSPITAL_COMMUNITY): Payer: Self-pay

## 2019-07-16 DIAGNOSIS — G7281 Critical illness myopathy: Secondary | ICD-10-CM | POA: Diagnosis present

## 2019-07-16 DIAGNOSIS — R509 Fever, unspecified: Secondary | ICD-10-CM

## 2019-07-16 DIAGNOSIS — Z4659 Encounter for fitting and adjustment of other gastrointestinal appliance and device: Secondary | ICD-10-CM

## 2019-07-16 DIAGNOSIS — U071 COVID-19: Secondary | ICD-10-CM | POA: Diagnosis present

## 2019-07-16 DIAGNOSIS — J9621 Acute and chronic respiratory failure with hypoxia: Secondary | ICD-10-CM | POA: Diagnosis present

## 2019-07-16 DIAGNOSIS — A419 Sepsis, unspecified organism: Secondary | ICD-10-CM | POA: Diagnosis present

## 2019-07-16 DIAGNOSIS — J1282 Pneumonia due to coronavirus disease 2019: Secondary | ICD-10-CM | POA: Diagnosis present

## 2019-07-16 DIAGNOSIS — J189 Pneumonia, unspecified organism: Secondary | ICD-10-CM

## 2019-07-16 HISTORY — DX: Acute and chronic respiratory failure with hypoxia: J96.21

## 2019-07-16 HISTORY — DX: Pneumonia due to coronavirus disease 2019: J12.82

## 2019-07-16 HISTORY — DX: Severe sepsis with septic shock: R65.21

## 2019-07-16 HISTORY — DX: Sepsis, unspecified organism: A41.9

## 2019-07-16 HISTORY — DX: Critical illness myopathy: G72.81

## 2019-07-16 HISTORY — DX: COVID-19: U07.1

## 2019-07-16 LAB — URINALYSIS, ROUTINE W REFLEX MICROSCOPIC
Bilirubin Urine: NEGATIVE
Glucose, UA: NEGATIVE mg/dL
Ketones, ur: NEGATIVE mg/dL
Nitrite: NEGATIVE
Protein, ur: 30 mg/dL — AB
Specific Gravity, Urine: 1.018 (ref 1.005–1.030)
pH: 5 (ref 5.0–8.0)

## 2019-07-17 ENCOUNTER — Encounter: Payer: Self-pay | Admitting: Internal Medicine

## 2019-07-17 DIAGNOSIS — J1282 Pneumonia due to coronavirus disease 2019: Secondary | ICD-10-CM

## 2019-07-17 DIAGNOSIS — R6521 Severe sepsis with septic shock: Secondary | ICD-10-CM | POA: Insufficient documentation

## 2019-07-17 DIAGNOSIS — G7281 Critical illness myopathy: Secondary | ICD-10-CM

## 2019-07-17 DIAGNOSIS — J9621 Acute and chronic respiratory failure with hypoxia: Secondary | ICD-10-CM | POA: Diagnosis not present

## 2019-07-17 DIAGNOSIS — U071 COVID-19: Secondary | ICD-10-CM

## 2019-07-17 DIAGNOSIS — R6520 Severe sepsis without septic shock: Secondary | ICD-10-CM | POA: Diagnosis present

## 2019-07-17 DIAGNOSIS — A419 Sepsis, unspecified organism: Secondary | ICD-10-CM | POA: Diagnosis present

## 2019-07-17 LAB — BLOOD CULTURE ID PANEL (REFLEXED)

## 2019-07-17 LAB — CBC WITH DIFFERENTIAL/PLATELET
Abs Immature Granulocytes: 0.15 10*3/uL — ABNORMAL HIGH (ref 0.00–0.07)
Basophils Absolute: 0.1 10*3/uL (ref 0.0–0.1)
Basophils Relative: 0 %
Eosinophils Absolute: 0.1 10*3/uL (ref 0.0–0.5)
Eosinophils Relative: 0 %
HCT: 41.6 % (ref 39.0–52.0)
Hemoglobin: 12.1 g/dL — ABNORMAL LOW (ref 13.0–17.0)
Immature Granulocytes: 1 %
Lymphocytes Relative: 7 %
Lymphs Abs: 1.2 10*3/uL (ref 0.7–4.0)
MCH: 28.6 pg (ref 26.0–34.0)
MCHC: 29.1 g/dL — ABNORMAL LOW (ref 30.0–36.0)
MCV: 98.3 fL (ref 80.0–100.0)
Monocytes Absolute: 1.3 10*3/uL — ABNORMAL HIGH (ref 0.1–1.0)
Monocytes Relative: 9 %
Neutro Abs: 13.1 10*3/uL — ABNORMAL HIGH (ref 1.7–7.7)
Neutrophils Relative %: 83 %
Platelets: 428 10*3/uL — ABNORMAL HIGH (ref 150–400)
RBC: 4.23 MIL/uL (ref 4.22–5.81)
RDW: 16.8 % — ABNORMAL HIGH (ref 11.5–15.5)
WBC: 15.8 10*3/uL — ABNORMAL HIGH (ref 4.0–10.5)
nRBC: 0 % (ref 0.0–0.2)

## 2019-07-17 LAB — COMPREHENSIVE METABOLIC PANEL
ALT: 279 U/L — ABNORMAL HIGH (ref 0–44)
AST: 186 U/L — ABNORMAL HIGH (ref 15–41)
Albumin: 2 g/dL — ABNORMAL LOW (ref 3.5–5.0)
Alkaline Phosphatase: 779 U/L — ABNORMAL HIGH (ref 38–126)
Anion gap: 12 (ref 5–15)
BUN: 54 mg/dL — ABNORMAL HIGH (ref 6–20)
CO2: 35 mmol/L — ABNORMAL HIGH (ref 22–32)
Calcium: 9.3 mg/dL (ref 8.9–10.3)
Chloride: 96 mmol/L — ABNORMAL LOW (ref 98–111)
Creatinine, Ser: 1.06 mg/dL (ref 0.61–1.24)
GFR calc Af Amer: >60 mL/min (ref >60)
GFR calc non Af Amer: >60 mL/min (ref >60)
Glucose, Bld: 225 mg/dL — ABNORMAL HIGH (ref 70–99)
Potassium: 5.3 mmol/L — ABNORMAL HIGH (ref 3.5–5.1)
Sodium: 143 mmol/L (ref 135–145)
Total Bilirubin: 1.2 mg/dL (ref 0.3–1.2)
Total Protein: 7.6 g/dL (ref 6.5–8.1)

## 2019-07-17 LAB — URINE CULTURE: Culture: 30000 — AB

## 2019-07-17 LAB — HEMOGLOBIN A1C
Hgb A1c MFr Bld: 11.9 % — ABNORMAL HIGH (ref 4.8–5.6)
Mean Plasma Glucose: 294.83 mg/dL

## 2019-07-17 LAB — PROTIME-INR
INR: 1.1 (ref 0.8–1.2)
Prothrombin Time: 14 seconds (ref 11.4–15.2)

## 2019-07-17 LAB — TSH: TSH: 0.28 u[IU]/mL — ABNORMAL LOW (ref 0.350–4.500)

## 2019-07-17 LAB — MAGNESIUM: Magnesium: 2.2 mg/dL (ref 1.7–2.4)

## 2019-07-17 LAB — PHOSPHORUS: Phosphorus: 5.1 mg/dL — ABNORMAL HIGH (ref 2.5–4.6)

## 2019-07-17 MED ORDER — INSULIN GLARGINE 100 UNIT/ML ~~LOC~~ SOLN
30.00 | SUBCUTANEOUS | Status: DC
Start: ? — End: 2019-07-17

## 2019-07-17 MED ORDER — INSULIN LISPRO 100 UNIT/ML ~~LOC~~ SOLN
1.00 | SUBCUTANEOUS | Status: DC
Start: 2019-07-16 — End: 2019-07-17

## 2019-07-17 MED ORDER — FENTANYL CITRATE (PF) 2500 MCG/50ML IJ SOLN
50.00 | INTRAMUSCULAR | Status: DC
Start: ? — End: 2019-07-17

## 2019-07-17 MED ORDER — ACETAMINOPHEN 160 MG/5ML PO SUSP
650.00 | ORAL | Status: DC
Start: ? — End: 2019-07-17

## 2019-07-17 MED ORDER — CHOLECALCIFEROL 25 MCG (1000 UT) PO TABS
1000.00 | ORAL_TABLET | ORAL | Status: DC
Start: 2019-07-16 — End: 2019-07-17

## 2019-07-17 MED ORDER — ENOXAPARIN SODIUM 40 MG/0.4ML ~~LOC~~ SOLN
40.00 | SUBCUTANEOUS | Status: DC
Start: 2019-07-17 — End: 2019-07-17

## 2019-07-17 MED ORDER — LABETALOL HCL 5 MG/ML IV SOLN
10.00 | INTRAVENOUS | Status: DC
Start: ? — End: 2019-07-17

## 2019-07-17 MED ORDER — MELATONIN 3 MG PO TABS
3.00 | ORAL_TABLET | ORAL | Status: DC
Start: 2019-07-15 — End: 2019-07-17

## 2019-07-17 MED ORDER — GENERIC EXTERNAL MEDICATION
Status: DC
Start: ? — End: 2019-07-17

## 2019-07-17 MED ORDER — INSULIN GLARGINE 100 UNIT/ML ~~LOC~~ SOLN
1.00 | SUBCUTANEOUS | Status: DC
Start: 2019-07-16 — End: 2019-07-17

## 2019-07-17 MED ORDER — OXYCODONE HCL 5 MG/5ML PO SOLN
10.00 | ORAL | Status: DC
Start: ? — End: 2019-07-17

## 2019-07-17 MED ORDER — DEXTROSE 10 % IV SOLN
50.00 | INTRAVENOUS | Status: DC
Start: ? — End: 2019-07-17

## 2019-07-17 MED ORDER — FENTANYL CITRATE (PF) 2500 MCG/50ML IJ SOLN
25.00 | INTRAMUSCULAR | Status: DC
Start: ? — End: 2019-07-17

## 2019-07-17 MED ORDER — DEXTROSE 50 % IV SOLN
25.00 | INTRAVENOUS | Status: DC
Start: ? — End: 2019-07-17

## 2019-07-17 MED ORDER — CLOTRIMAZOLE-BETAMETHASONE 1-0.05 % EX CREA
TOPICAL_CREAM | CUTANEOUS | Status: DC
Start: 2019-07-16 — End: 2019-07-17

## 2019-07-17 MED ORDER — METOPROLOL TARTRATE 25 MG PO TABS
12.50 | ORAL_TABLET | ORAL | Status: DC
Start: 2019-07-16 — End: 2019-07-17

## 2019-07-17 MED ORDER — SODIUM CHLORIDE 0.9 % IV SOLN
10.00 | INTRAVENOUS | Status: DC
Start: ? — End: 2019-07-17

## 2019-07-17 MED ORDER — ONDANSETRON HCL 4 MG/2ML IJ SOLN
4.00 | INTRAMUSCULAR | Status: DC
Start: ? — End: 2019-07-17

## 2019-07-17 NOTE — Consult Note (Signed)
P[facility] [place]  Date of Service: 07/17/2019  PULMONARY CRITICAL CARE CONSULT   Steve Parker  [id number]  DOB: [birth year]   DOA: 07/16/2019  Referring Physician: Merton Border, MD  HPI: Steve Parker is a 54 y.o. male seen for follow up of Acute on Chronic Respiratory Failure.  Patient with multiple medical problems including chronic arthritis diabetes type 2 hypertension presented to the hospital because of increasing shortness of breath.  Patient was Covid 19+ required prolonged mechanical ventilation eventually ended up with a tracheostomy for failure to wean off the vent.  Patient other complications included sepsis and shock along with decompensation of diastolic heart failure.  Patient also developed critical illness myopathy has considerable weakness eventually was weaned to NAG which the patient is on right now and is transferred to our facility for further management.  Review of Systems:  ROS performed and is unremarkable other than noted above.  Past Medical History:  Diagnosis Date  . Arthritis   . Cancer ([place])     right side of eye; skin  . Diabetes mellitus without complication ([place])    Type II  . History of kidney stones    surgical  . Hypertension   . Spondylolisthesis of lumbar region     Past Surgical History:  Procedure Laterality Date  . COLONOSCOPY W/ BIOPSIES AND POLYPECTOMY    . CYSTOSCOPY     kidney stone removal  . LUMBAR FUSION  2008   L3, L4  . LUMBAR FUSION  09/16/2018  . SKIN CANCER EXCISION Right    lateral side    Social History:    reports that he has never smoked. He has never used smokeless tobacco. He reports current alcohol use. He reports that he does not use drugs.  Family History: Non-Contributory to the present illness  Allergies  Allergen Reactions  . Niacin Other (See Comments)    Lightheaded, fatigue and hot flashes    Medications: Reviewed on  Rounds  Physical Exam:  Vitals: Temperature 97.4 pulse 108 respiratory 27 blood pressure is 127/88 saturations 100%  Ventilator Settings off the ventilator on the NAG  . General: Comfortable at this time . Eyes: Grossly normal lids, irises & conjunctiva . ENT: grossly tongue is normal . Neck: no obvious mass . Cardiovascular: S1-S2 normal no gallop or rub . Respiratory: Scattered rhonchi expansion is equal . Abdomen: Soft and nontender . Skin: no rash seen on limited exam . Musculoskeletal: not rigid . Psychiatric:unable to assess . Neurologic: no seizure no involuntary movements         Labs on Admission:  Basic Metabolic Panel: Recent Labs  Lab 07/17/19 0629  NA 143  K 5.3*  CL 96*  CO2 35*  GLUCOSE 225*  BUN 54*  CREATININE 1.06  CALCIUM 9.3  MG 2.2  PHOS 5.1*    No results for input(s): PHART, PCO2ART, PO2ART, HCO3, O2SAT in the last 168 hours.  Liver Function Tests: Recent Labs  Lab 07/17/19 0629  AST 186*  ALT 279*  ALKPHOS 779*  BILITOT 1.2  PROT 7.6  ALBUMIN 2.0*   No results for input(s): LIPASE, AMYLASE in the last 168 hours. No results for input(s): AMMONIA in the last 168 hours.  CBC: Recent Labs  Lab 07/17/19 0629  WBC 15.8*  NEUTROABS 13.1*  HGB 12.1*  HCT 41.6  MCV 98.3  PLT 428*    Cardiac Enzymes: No results for input(s): CKTOTAL, CKMB, CKMBINDEX, TROPONINI  in the last 168 hours.  BNP (last 3 results) No results for input(s): BNP in the last 8760 hours.  ProBNP (last 3 results) No results for input(s): PROBNP in the last 8760 hours.   Radiological Exams on Admission: DG CHEST PORT 1 VIEW  Result Date: 07/16/2019 CLINICAL DATA:  New admission to [facility]. Pneumonia. EXAM: PORTABLE CHEST 1 VIEW COMPARISON:  Radiograph 06/12/2019 at [facility]: Tracheostomy tube tip at the thoracic inlet. Enteric tube in place with tip below the diaphragm not included in the field of view. Progressive elevation of  right hemidiaphragm. Heterogeneous bilateral lung opacities which have improved from prior exam. Upper normal heart size with grossly unchanged mediastinal contours. No pneumothorax. No large pleural effusion. IMPRESSION: 1. Heterogeneous bilateral lung opacities, improved from exam 5 weeks ago, likely resolving pneumonia. 2. Progressive elevation of right hemidiaphragm. 3. Tracheostomy tube tip at the thoracic inlet. Electronically Signed   By: Keith Rake M.D.   On: 07/16/2019 16:48   DG Abd Portable 1V  Result Date: 07/16/2019 CLINICAL DATA:  NG tube placement. EXAM: PORTABLE ABDOMEN - 1 VIEW COMPARISON:  Concurrent chest radiograph. Abdominal CT 10/14/2008 FINDINGS: Tip of the weighted enteric tube below the diaphragm in the left mid abdomen. The tube follows the expected location of the duodenum and may be in the proximal jejunum. Nonobstructive bowel gas pattern. Postsurgical change in the lower lumbar spine. IMPRESSION: Weighted enteric tube in place with tip below the diaphragm in the left mid abdomen. The tubing follows the expected location of the duodenum, in the tip is likely in the proximal jejunum. Alternatively this may be within a distended stomach, however there is no evidence of gaseous gastric distension to support this. Electronically Signed   By: Keith Rake M.D.   On: 07/16/2019 16:46    Assessment/Plan Active Problems:   Acute on chronic respiratory failure with hypoxia ([place])   COVID-19 virus infection   Critical illness myopathy   Severe sepsis ([place])   Pneumonia due to COVID-19 virus   1. Acute on chronic respiratory failure hypoxia plan is to continue with the wean protocol patient should be completing 24 hours off the ventilator once this is done we will switch over to PMV and capping. 2. COVID-19 virus infection treated now is in resolution phase patient still has some residual changes noted on the chest x-ray.  Also of note is some elevation of the hemidiaphragm  will need to keep an eye on this 3. Severe sepsis with shock this is resolved we will continue to follow along. 4. Critical illness myopathy will need ongoing physical therapy 5. COVID-19 pneumonia improving based on the last chest x-ray we will continue to monitor  I have personally seen and evaluated the patient, evaluated laboratory and imaging results, formulated the assessment and plan and placed orders. The Patient requires high complexity decision making with multiple systems involvement.  Case was discussed on Rounds with the Respiratory Therapy Director and the Respiratory staff Time Spent 62minutes  Steve Ishman A Azaria Stegman, MD [facility] Pulmonary Critical Care Medicine Sleep Medicine

## 2019-07-18 DIAGNOSIS — G7281 Critical illness myopathy: Secondary | ICD-10-CM | POA: Diagnosis not present

## 2019-07-18 DIAGNOSIS — U071 COVID-19: Secondary | ICD-10-CM | POA: Diagnosis not present

## 2019-07-18 DIAGNOSIS — A419 Sepsis, unspecified organism: Secondary | ICD-10-CM | POA: Diagnosis not present

## 2019-07-18 DIAGNOSIS — J9621 Acute and chronic respiratory failure with hypoxia: Secondary | ICD-10-CM | POA: Diagnosis not present

## 2019-07-18 LAB — CBC
HCT: 39.5 % (ref 39.0–52.0)
Hemoglobin: 11.4 g/dL — ABNORMAL LOW (ref 13.0–17.0)
MCH: 28.4 pg (ref 26.0–34.0)
MCHC: 28.9 g/dL — ABNORMAL LOW (ref 30.0–36.0)
MCV: 98.3 fL (ref 80.0–100.0)
Platelets: 433 10*3/uL — ABNORMAL HIGH (ref 150–400)
RBC: 4.02 MIL/uL — ABNORMAL LOW (ref 4.22–5.81)
RDW: 16.4 % — ABNORMAL HIGH (ref 11.5–15.5)
WBC: 14.5 10*3/uL — ABNORMAL HIGH (ref 4.0–10.5)
nRBC: 0 % (ref 0.0–0.2)

## 2019-07-18 LAB — RENAL FUNCTION PANEL
Albumin: 1.7 g/dL — ABNORMAL LOW (ref 3.5–5.0)
Anion gap: 9 (ref 5–15)
BUN: 62 mg/dL — ABNORMAL HIGH (ref 6–20)
CO2: 39 mmol/L — ABNORMAL HIGH (ref 22–32)
Calcium: 8.8 mg/dL — ABNORMAL LOW (ref 8.9–10.3)
Chloride: 98 mmol/L (ref 98–111)
Creatinine, Ser: 1.06 mg/dL (ref 0.61–1.24)
GFR calc Af Amer: >60 mL/min (ref >60)
GFR calc non Af Amer: >60 mL/min (ref >60)
Glucose, Bld: 360 mg/dL — ABNORMAL HIGH (ref 70–99)
Phosphorus: 3.2 mg/dL (ref 2.5–4.6)
Potassium: 4.3 mmol/L (ref 3.5–5.1)
Sodium: 146 mmol/L — ABNORMAL HIGH (ref 135–145)

## 2019-07-18 LAB — MAGNESIUM: Magnesium: 2.1 mg/dL (ref 1.7–2.4)

## 2019-07-18 MED ORDER — GENERIC EXTERNAL MEDICATION
Status: DC
Start: ? — End: 2019-07-18

## 2019-07-18 NOTE — Progress Notes (Signed)
Pulmonary Critical Care Medicine [place]   PULMONARY CRITICAL CARE SERVICE  PROGRESS NOTE  Date of Service: 07/18/2019  Steve Parker  [id number]  DOB: 04-12-1966   DOA: 07/16/2019  Referring Physician: Merton Border, MD  HPI: Steve Parker is a 54 y.o. male seen for follow up of Acute on Chronic Respiratory Failure.  Patient is off the ventilator on 1 L NAG  Medications: Reviewed on Rounds  Physical Exam:  Vitals: Temperature is 99.1 pulse 113 respiratory rate 29 blood pressure is 130/79 saturations 94%  Ventilator Settings off the ventilator on the NAG  . General: Comfortable at this time . Eyes: Grossly normal lids, irises & conjunctiva . ENT: grossly tongue is normal . Neck: no obvious mass . Cardiovascular: S1 S2 normal no gallop . Respiratory: No rhonchi coarse breath sounds . Abdomen: soft . Skin: no rash seen on limited exam . Musculoskeletal: not rigid . Psychiatric:unable to assess . Neurologic: no seizure no involuntary movements         Lab Data:   Basic Metabolic Panel: Recent Labs  Lab 07/17/19 0629 07/18/19 0714  NA 143 146*  K 5.3* 4.3  CL 96* 98  CO2 35* 39*  GLUCOSE 225* 360*  BUN 54* 62*  CREATININE 1.06 1.06  CALCIUM 9.3 8.8*  MG 2.2 2.1  PHOS 5.1* 3.2    ABG: No results for input(s): PHART, PCO2ART, PO2ART, HCO3, O2SAT in the last 168 hours.  Liver Function Tests: Recent Labs  Lab 07/17/19 0629 07/18/19 0714  AST 186*  --   ALT 279*  --   ALKPHOS 779*  --   BILITOT 1.2  --   PROT 7.6  --   ALBUMIN 2.0* 1.7*   No results for input(s): LIPASE, AMYLASE in the last 168 hours. No results for input(s): AMMONIA in the last 168 hours.  CBC: Recent Labs  Lab 07/17/19 0629 07/18/19 0714  WBC 15.8* 14.5*  NEUTROABS 13.1*  --   HGB 12.1* 11.4*  HCT 41.6 39.5  MCV 98.3 98.3  PLT 428* 433*    Cardiac Enzymes: No results for input(s): CKTOTAL, CKMB, CKMBINDEX, TROPONINI in the last 168 hours.  BNP (last  3 results) No results for input(s): BNP in the last 8760 hours.  ProBNP (last 3 results) No results for input(s): PROBNP in the last 8760 hours.  Radiological Exams: DG CHEST PORT 1 VIEW  Result Date: 07/16/2019 CLINICAL DATA:  New admission to specialty [facility]. Pneumonia. EXAM: PORTABLE CHEST 1 VIEW COMPARISON:  Radiograph 06/12/2019 at [place]: Tracheostomy tube tip at the thoracic inlet. Enteric tube in place with tip below the diaphragm not included in the field of view. Progressive elevation of right hemidiaphragm. Heterogeneous bilateral lung opacities which have improved from prior exam. Upper normal heart size with grossly unchanged mediastinal contours. No pneumothorax. No large pleural effusion. IMPRESSION: 1. Heterogeneous bilateral lung opacities, improved from exam 5 weeks ago, likely resolving pneumonia. 2. Progressive elevation of right hemidiaphragm. 3. Tracheostomy tube tip at the thoracic inlet. Electronically Signed   By: Keith Rake M.D.   On: 07/16/2019 16:48   DG Abd Portable 1V  Result Date: 07/16/2019 CLINICAL DATA:  NG tube placement. EXAM: PORTABLE ABDOMEN - 1 VIEW COMPARISON:  Concurrent chest radiograph. Abdominal CT 10/14/2008 FINDINGS: Tip of the weighted enteric tube below the diaphragm in the left mid abdomen. The tube follows the expected location of the duodenum and may be in the proximal jejunum. Nonobstructive bowel gas  pattern. Postsurgical change in the lower lumbar spine. IMPRESSION: Weighted enteric tube in place with tip below the diaphragm in the left mid abdomen. The tubing follows the expected location of the duodenum, in the tip is likely in the proximal jejunum. Alternatively this may be within a distended stomach, however there is no evidence of gaseous gastric distension to support this. Electronically Signed   By: Keith Rake M.D.   On: 07/16/2019 16:46    Assessment/Plan Active Problems:   Acute on chronic respiratory  failure with hypoxia ([place])   COVID-19 virus infection   Critical illness myopathy   Severe sepsis ([place])   Pneumonia due to COVID-19 virus   1. Acute on chronic respiratory failure with hypoxia we will continue with NAG titrate oxygen continue pulmonary toilet patient right now is on 1 L 2. COVID-19 virus infection in resolution phase we will continue with supportive care 3. Critical illness myopathy no change we will continue to follow 4. Severe sepsis hemodynamics are stable 5. Pneumonia due to COVID-19 treated clinically improving   I have personally seen and evaluated the patient, evaluated laboratory and imaging results, formulated the assessment and plan and placed orders. The Patient requires high complexity decision making with multiple systems involvement.  Rounds were done with the Respiratory Therapy Director and Staff therapists and discussed with nursing staff also.  Allyne Gee, MD [facility] Pulmonary Critical Care Medicine Sleep Medicine

## 2019-07-19 DIAGNOSIS — G7281 Critical illness myopathy: Secondary | ICD-10-CM | POA: Diagnosis not present

## 2019-07-19 DIAGNOSIS — U071 COVID-19: Secondary | ICD-10-CM | POA: Diagnosis not present

## 2019-07-19 DIAGNOSIS — J9621 Acute and chronic respiratory failure with hypoxia: Secondary | ICD-10-CM | POA: Diagnosis not present

## 2019-07-19 DIAGNOSIS — A419 Sepsis, unspecified organism: Secondary | ICD-10-CM | POA: Diagnosis not present

## 2019-07-19 LAB — CULTURE, RESPIRATORY W GRAM STAIN: Culture: NORMAL

## 2019-07-19 LAB — CBC
HCT: 39.4 % (ref 39.0–52.0)
Hemoglobin: 11.1 g/dL — ABNORMAL LOW (ref 13.0–17.0)
MCH: 28.2 pg (ref 26.0–34.0)
MCHC: 28.2 g/dL — ABNORMAL LOW (ref 30.0–36.0)
MCV: 100.3 fL — ABNORMAL HIGH (ref 80.0–100.0)
Platelets: 454 10*3/uL — ABNORMAL HIGH (ref 150–400)
RBC: 3.93 MIL/uL — ABNORMAL LOW (ref 4.22–5.81)
RDW: 16.4 % — ABNORMAL HIGH (ref 11.5–15.5)
WBC: 14.4 10*3/uL — ABNORMAL HIGH (ref 4.0–10.5)
nRBC: 0 % (ref 0.0–0.2)

## 2019-07-19 LAB — BASIC METABOLIC PANEL
Anion gap: 10 (ref 5–15)
BUN: 65 mg/dL — ABNORMAL HIGH (ref 6–20)
CO2: 38 mmol/L — ABNORMAL HIGH (ref 22–32)
Calcium: 8.8 mg/dL — ABNORMAL LOW (ref 8.9–10.3)
Chloride: 97 mmol/L — ABNORMAL LOW (ref 98–111)
Creatinine, Ser: 0.97 mg/dL (ref 0.61–1.24)
GFR calc Af Amer: >60 mL/min (ref >60)
GFR calc non Af Amer: >60 mL/min (ref >60)
Glucose, Bld: 301 mg/dL — ABNORMAL HIGH (ref 70–99)
Potassium: 4.8 mmol/L (ref 3.5–5.1)
Sodium: 145 mmol/L (ref 135–145)

## 2019-07-19 LAB — CULTURE, BLOOD (ROUTINE X 2)
Special Requests: ADEQUATE
Special Requests: ADEQUATE

## 2019-07-19 LAB — VANCOMYCIN, TROUGH: Vancomycin Tr: 28 ug/mL (ref 15–20)

## 2019-07-19 LAB — MAGNESIUM: Magnesium: 2.2 mg/dL (ref 1.7–2.4)

## 2019-07-19 NOTE — Progress Notes (Signed)
Pulmonary Critical Care Medicine [place]   PULMONARY CRITICAL CARE SERVICE  PROGRESS NOTE  Date of Service: 07/19/2019  [name]  [id number]  DOB: 1965/10/12   DOA: 07/16/2019  Referring Physician: Merton Border, MD  HPI: Steve Parker is a 54 y.o. male seen for follow up of Acute on Chronic Respiratory Failure.  Patient currently is on the NAG has been on 1 L of O2 however patient did have a fever today  Medications: Reviewed on Rounds  Physical Exam:  Vitals: Temperature 101.0 pulse 103 respiratory rate 24 blood pressure is 111/61 saturations 95%  Ventilator Settings off the ventilator on the NAG on 1 L of oxygen  . General: Comfortable at this time . Eyes: Grossly normal lids, irises & conjunctiva . ENT: grossly tongue is normal . Neck: no obvious mass . Cardiovascular: S1 S2 normal no gallop . Respiratory: Coarse rhonchi expansion equal . Abdomen: soft . Skin: no rash seen on limited exam . Musculoskeletal: not rigid . Psychiatric:unable to assess . Neurologic: no seizure no involuntary movements         Lab Data:   Basic Metabolic Panel: Recent Labs  Lab 07/17/19 0629 07/18/19 0714 07/19/19 0104  NA 143 146* 145  K 5.3* 4.3 4.8  CL 96* 98 97*  CO2 35* 39* 38*  GLUCOSE 225* 360* 301*  BUN 54* 62* 65*  CREATININE 1.06 1.06 0.97  CALCIUM 9.3 8.8* 8.8*  MG 2.2 2.1 2.2  PHOS 5.1* 3.2  --     ABG: No results for input(s): PHART, PCO2ART, PO2ART, HCO3, O2SAT in the last 168 hours.  Liver Function Tests: Recent Labs  Lab 07/17/19 0629 07/18/19 0714  AST 186*  --   ALT 279*  --   ALKPHOS 779*  --   BILITOT 1.2  --   PROT 7.6  --   ALBUMIN 2.0* 1.7*   No results for input(s): LIPASE, AMYLASE in the last 168 hours. No results for input(s): AMMONIA in the last 168 hours.  CBC: Recent Labs  Lab 07/17/19 0629 07/18/19 0714 07/19/19 0104  WBC 15.8* 14.5* 14.4*  NEUTROABS 13.1*  --   --   HGB 12.1* 11.4* 11.1*  HCT 41.6  39.5 39.4  MCV 98.3 98.3 100.3*  PLT 428* 433* 454*    Cardiac Enzymes: No results for input(s): CKTOTAL, CKMB, CKMBINDEX, TROPONINI in the last 168 hours.  BNP (last 3 results) No results for input(s): BNP in the last 8760 hours.  ProBNP (last 3 results) No results for input(s): PROBNP in the last 8760 hours.  Radiological Exams: No results found.  Assessment/Plan Active Problems:   Acute on chronic respiratory failure with hypoxia ([place])   COVID-19 virus infection   Critical illness myopathy   Severe sepsis ([place])   Pneumonia due to COVID-19 virus   1. Acute on chronic respiratory failure with hypoxia we will continue with oxygen therapy and supportive care fever will be worked up hold off on capping for now 2. COVID-19 virus infection we will continue with supportive care in resolution phase 3. Critical illness myopathy therapy as tolerated 4. Severe sepsis hemodynamics are stable 5. Pneumonia due to COVID-19 treated clinically is improving however now has a fever which will be worked up   I have personally seen and evaluated the patient, evaluated laboratory and imaging results, formulated the assessment and plan and placed orders. The Patient requires high complexity decision making with multiple systems involvement.  Rounds were done with the  Respiratory Therapy Director and Staff therapists and discussed with nursing staff also.  Allyne Gee, MD [facility] Pulmonary Critical Care Medicine Sleep Medicine

## 2019-07-20 ENCOUNTER — Other Ambulatory Visit (HOSPITAL_COMMUNITY): Payer: Self-pay

## 2019-07-20 DIAGNOSIS — J9621 Acute and chronic respiratory failure with hypoxia: Secondary | ICD-10-CM | POA: Diagnosis not present

## 2019-07-20 DIAGNOSIS — U071 COVID-19: Secondary | ICD-10-CM | POA: Diagnosis not present

## 2019-07-20 DIAGNOSIS — A419 Sepsis, unspecified organism: Secondary | ICD-10-CM | POA: Diagnosis not present

## 2019-07-20 DIAGNOSIS — G7281 Critical illness myopathy: Secondary | ICD-10-CM | POA: Diagnosis not present

## 2019-07-20 LAB — RENAL FUNCTION PANEL
Albumin: 1.7 g/dL — ABNORMAL LOW (ref 3.5–5.0)
Anion gap: 11 (ref 5–15)
BUN: 62 mg/dL — ABNORMAL HIGH (ref 6–20)
CO2: 38 mmol/L — ABNORMAL HIGH (ref 22–32)
Calcium: 8.9 mg/dL (ref 8.9–10.3)
Chloride: 92 mmol/L — ABNORMAL LOW (ref 98–111)
Creatinine, Ser: 0.85 mg/dL (ref 0.61–1.24)
GFR calc Af Amer: >60 mL/min (ref >60)
GFR calc non Af Amer: >60 mL/min (ref >60)
Glucose, Bld: 252 mg/dL — ABNORMAL HIGH (ref 70–99)
Phosphorus: 3.9 mg/dL (ref 2.5–4.6)
Potassium: 5.4 mmol/L — ABNORMAL HIGH (ref 3.5–5.1)
Sodium: 141 mmol/L (ref 135–145)

## 2019-07-20 LAB — CBC
HCT: 39.9 % (ref 39.0–52.0)
Hemoglobin: 11.3 g/dL — ABNORMAL LOW (ref 13.0–17.0)
MCH: 28 pg (ref 26.0–34.0)
MCHC: 28.3 g/dL — ABNORMAL LOW (ref 30.0–36.0)
MCV: 98.8 fL (ref 80.0–100.0)
Platelets: 505 10*3/uL — ABNORMAL HIGH (ref 150–400)
RBC: 4.04 MIL/uL — ABNORMAL LOW (ref 4.22–5.81)
RDW: 16.1 % — ABNORMAL HIGH (ref 11.5–15.5)
WBC: 12.8 10*3/uL — ABNORMAL HIGH (ref 4.0–10.5)
nRBC: 0 % (ref 0.0–0.2)

## 2019-07-20 LAB — MAGNESIUM: Magnesium: 2.2 mg/dL (ref 1.7–2.4)

## 2019-07-20 MED ORDER — GENERIC EXTERNAL MEDICATION
Status: DC
Start: ? — End: 2019-07-20

## 2019-07-20 NOTE — Progress Notes (Signed)
  Echocardiogram 2D Echocardiogram has been performed.  Darlina Sicilian M 07/20/2019, 4:39 PM

## 2019-07-20 NOTE — Progress Notes (Signed)
Pulmonary Critical Care Medicine [place]   PULMONARY CRITICAL CARE SERVICE  PROGRESS NOTE  Date of Service: 07/20/2019  Steve Parker  [id number]  DOB: May 05, 1965   DOA: 07/16/2019  Referring Physician: Merton Border, MD  HPI: Steve Parker is a 54 y.o. male seen for follow up of Acute on Chronic Respiratory Failure.  Patient at this time is on the NAG has been on 1 L of oxygen ready for change of the tracheostomy today  Medications: Reviewed on Rounds  Physical Exam:  Vitals: Temperature 98.4 pulse 99 respiratory rate 24 blood pressure is 133/93 saturations 100%  Ventilator Settings off the ventilator on the NAG currently  . General: Comfortable at this time . Eyes: Grossly normal lids, irises & conjunctiva . ENT: grossly tongue is normal . Neck: no obvious mass . Cardiovascular: S1 S2 normal no gallop . Respiratory: No rhonchi no rales are noted at this time . Abdomen: soft . Skin: no rash seen on limited exam . Musculoskeletal: not rigid . Psychiatric:unable to assess . Neurologic: no seizure no involuntary movements         Lab Data:   Basic Metabolic Panel: Recent Labs  Lab 07/17/19 0629 07/18/19 0714 07/19/19 0104 07/20/19 0639  NA 143 146* 145 141  K 5.3* 4.3 4.8 5.4*  CL 96* 98 97* 92*  CO2 35* 39* 38* 38*  GLUCOSE 225* 360* 301* 252*  BUN 54* 62* 65* 62*  CREATININE 1.06 1.06 0.97 0.85  CALCIUM 9.3 8.8* 8.8* 8.9  MG 2.2 2.1 2.2 2.2  PHOS 5.1* 3.2  --  3.9    ABG: No results for input(s): PHART, PCO2ART, PO2ART, HCO3, O2SAT in the last 168 hours.  Liver Function Tests: Recent Labs  Lab 07/17/19 0629 07/18/19 0714 07/20/19 0639  AST 186*  --   --   ALT 279*  --   --   ALKPHOS 779*  --   --   BILITOT 1.2  --   --   PROT 7.6  --   --   ALBUMIN 2.0* 1.7* 1.7*   No results for input(s): LIPASE, AMYLASE in the last 168 hours. No results for input(s): AMMONIA in the last 168 hours.  CBC: Recent Labs  Lab 07/17/19 0629  07/18/19 0714 07/19/19 0104 07/20/19 0639  WBC 15.8* 14.5* 14.4* 12.8*  NEUTROABS 13.1*  --   --   --   HGB 12.1* 11.4* 11.1* 11.3*  HCT 41.6 39.5 39.4 39.9  MCV 98.3 98.3 100.3* 98.8  PLT 428* 433* 454* 505*    Cardiac Enzymes: No results for input(s): CKTOTAL, CKMB, CKMBINDEX, TROPONINI in the last 168 hours.  BNP (last 3 results) No results for input(s): BNP in the last 8760 hours.  ProBNP (last 3 results) No results for input(s): PROBNP in the last 8760 hours.  Radiological Exams: No results found.  Assessment/Plan Active Problems:   Acute on chronic respiratory failure with hypoxia ([place])   COVID-19 virus infection   Critical illness myopathy   Severe sepsis ([place])   Pneumonia due to COVID-19 virus   1. Acute on chronic respiratory failure hypoxia we will continue to wean as tolerated titrate oxygen continue pulmonary toilet 2. COVID-19 virus infection in resolution phase 3. Critical illness myopathy at baseline 4. Severe sepsis resolved 5. Pneumonia due to COVID-19 treated continue with supportive care   I have personally seen and evaluated the patient, evaluated laboratory and imaging results, formulated the assessment and plan and placed orders. The  Patient requires high complexity decision making with multiple systems involvement.  Rounds were done with the Respiratory Therapy Director and Staff therapists and discussed with nursing staff also.  Allyne Gee, MD [facility] Pulmonary Critical Care Medicine Sleep Medicine

## 2019-07-21 DIAGNOSIS — J9621 Acute and chronic respiratory failure with hypoxia: Secondary | ICD-10-CM | POA: Diagnosis not present

## 2019-07-21 DIAGNOSIS — U071 COVID-19: Secondary | ICD-10-CM | POA: Diagnosis not present

## 2019-07-21 DIAGNOSIS — G7281 Critical illness myopathy: Secondary | ICD-10-CM | POA: Diagnosis not present

## 2019-07-21 DIAGNOSIS — A419 Sepsis, unspecified organism: Secondary | ICD-10-CM | POA: Diagnosis not present

## 2019-07-21 LAB — POTASSIUM: Potassium: 4.5 mmol/L (ref 3.5–5.1)

## 2019-07-21 LAB — VANCOMYCIN, TROUGH: Vancomycin Tr: 11 ug/mL — ABNORMAL LOW (ref 15–20)

## 2019-07-21 NOTE — Progress Notes (Signed)
Pulmonary Critical Care Medicine [place]   PULMONARY CRITICAL CARE SERVICE  PROGRESS NOTE  Date of Service: 07/21/2019  Steve Parker  [id number]  DOB: 1966-02-19   DOA: 07/16/2019  Referring Physician: Merton Border, MD  HPI: Steve Parker is a 54 y.o. male seen for follow up of Acute on Chronic Respiratory Failure.  Patient at this time is on the NAG is on 1 L O2  Medications: Reviewed on Rounds  Physical Exam:  Vitals: Temperature is 97.2 pulse 105 respiratory rate 28 blood pressure is 144/83  Ventilator Settings off the ventilator on NAG 1 L  . General: Comfortable at this time . Eyes: Grossly normal lids, irises & conjunctiva . ENT: grossly tongue is normal . Neck: no obvious mass . Cardiovascular: S1 S2 normal no gallop . Respiratory: No rhonchi no rales noted . Abdomen: soft . Skin: no rash seen on limited exam . Musculoskeletal: not rigid . Psychiatric:unable to assess . Neurologic: no seizure no involuntary movements         Lab Data:   Basic Metabolic Panel: Recent Labs  Lab 07/17/19 0629 07/18/19 0714 07/19/19 0104 07/20/19 0639 07/21/19 0449  NA 143 146* 145 141  --   K 5.3* 4.3 4.8 5.4* 4.5  CL 96* 98 97* 92*  --   CO2 35* 39* 38* 38*  --   GLUCOSE 225* 360* 301* 252*  --   BUN 54* 62* 65* 62*  --   CREATININE 1.06 1.06 0.97 0.85  --   CALCIUM 9.3 8.8* 8.8* 8.9  --   MG 2.2 2.1 2.2 2.2  --   PHOS 5.1* 3.2  --  3.9  --     ABG: No results for input(s): PHART, PCO2ART, PO2ART, HCO3, O2SAT in the last 168 hours.  Liver Function Tests: Recent Labs  Lab 07/17/19 0629 07/18/19 0714 07/20/19 0639  AST 186*  --   --   ALT 279*  --   --   ALKPHOS 779*  --   --   BILITOT 1.2  --   --   PROT 7.6  --   --   ALBUMIN 2.0* 1.7* 1.7*   No results for input(s): LIPASE, AMYLASE in the last 168 hours. No results for input(s): AMMONIA in the last 168 hours.  CBC: Recent Labs  Lab 07/17/19 0629 07/18/19 0714 07/19/19 0104  07/20/19 0639  WBC 15.8* 14.5* 14.4* 12.8*  NEUTROABS 13.1*  --   --   --   HGB 12.1* 11.4* 11.1* 11.3*  HCT 41.6 39.5 39.4 39.9  MCV 98.3 98.3 100.3* 98.8  PLT 428* 433* 454* 505*    Cardiac Enzymes: No results for input(s): CKTOTAL, CKMB, CKMBINDEX, TROPONINI in the last 168 hours.  BNP (last 3 results) No results for input(s): BNP in the last 8760 hours.  ProBNP (last 3 results) No results for input(s): PROBNP in the last 8760 hours.  Radiological Exams: ECHOCARDIOGRAM COMPLETE  Result Date: 07/20/2019    ECHOCARDIOGRAM REPORT   Patient Name:   Steve Parker Date of Exam: 07/20/2019 Medical Rec #:  [id number]    Height:       73.0 in Accession #:    [id number]   Weight:       258.4 lb Date of Birth:  [birth year]    BSA:          2.399 m Patient Age:    89 years     BP:  133/93 mmHg Patient Gender: M            HR:           110 bpm. Exam Location:  Inpatient Procedure: 2D Echo Indications:     Bacteremia 790.7 / R78.81  History:         Patient has no prior history of Echocardiogram examinations.                  Risk Factors:Hypertension and Diabetes. History of COVID 19.                  Acute on chronic respiratory failure with hypoxia . Severe                  sepsis. Pneumonia.  Sonographer:     Darlina Sicilian RDCS Referring Phys:  [place] Diagnosing Phys: Dixie Dials MD  Sonographer Comments: tracheostomy IMPRESSIONS  1. Left ventricular ejection fraction, by estimation, is 50 to 55%. The left ventricle has mildly decreased function. The left ventricle demonstrates regional wall motion abnormalities (see scoring diagram/findings for description). There is moderate concentric left ventricular hypertrophy. Left ventricular diastolic parameters are consistent with Grade I diastolic dysfunction (impaired relaxation). There is mild hypokinesis of the left ventricular, entire septal wall.  2. Prominent moderator band. Right ventricular systolic function is mildly reduced. The  right ventricular size is normal.  3. Left atrial size was mildly dilated.  4. The mitral valve is normal in structure. Mild mitral valve regurgitation.  5. The aortic valve is tricuspid. Aortic valve regurgitation is not visualized. No aortic stenosis is present.  6. There is mild (Grade II) atheroma plaque involving the ascending aorta and aortic root.  7. The inferior vena cava is normal in size with greater than 50% respiratory variability, suggesting right atrial pressure of 3 mmHg. 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 FINDINGS  Left Ventricle: Left ventricular ejection fraction, by estimation, is 50 to 55%. The left ventricle has mildly decreased function. The left ventricle demonstrates regional wall motion abnormalities. Mild hypokinesis of the left ventricular, entire septal wall. The left ventricular internal cavity size was normal in size. There is moderate concentric left ventricular hypertrophy. Left ventricular diastolic parameters are consistent with Grade I diastolic dysfunction (impaired relaxation). Right Ventricle: Prominent moderator band. The right ventricular size is normal. No increase in right ventricular wall thickness. Right ventricular systolic function is mildly reduced. Left Atrium: Left atrial size was mildly dilated. Right Atrium: Right atrial size was normal in size. Pericardium: Trivial pericardial effusion is present. The pericardial effusion is lateral to the left ventricle. Mitral Valve: The mitral valve is normal in structure. There is mild calcification of the anterior mitral valve leaflet(s). Mild mitral valve regurgitation. Tricuspid Valve: The tricuspid valve is normal in structure. Tricuspid valve regurgitation is mild. Aortic Valve: The aortic valve is tricuspid. Aortic valve regurgitation is not visualized. No aortic stenosis is present. Pulmonic Valve: The pulmonic valve was normal in structure.  Pulmonic valve regurgitation is trivial. Aorta: The aortic root is normal in size and structure. There is mild (Grade II) atheroma plaque involving the ascending aorta and aortic root. Venous: The inferior vena cava is normal in size with greater than 50% respiratory variability, suggesting right atrial pressure of 3 mmHg. IAS/Shunts: No atrial level shunt detected by color flow Doppler.  LEFT VENTRICLE PLAX 2D LVIDd:         4.80 cm  Diastology LVIDs:  3.20 cm  LV e' lateral:   9.25 cm/s LV PW:         1.50 cm  LV E/e' lateral: 4.3 LV IVS:        1.60 cm  LV e' medial:    6.20 cm/s LVOT diam:     2.10 cm  LV E/e' medial:  6.4 LV SV:         39 LV SV Index:   16 LVOT Area:     3.46 cm  RIGHT VENTRICLE RV S prime:     16.90 cm/s TAPSE (M-mode): 1.8 cm LEFT ATRIUM             Index LA diam:        3.80 cm 1.58 cm/m LA Vol (A2C):   25.6 ml 10.67 ml/m LA Vol (A4C):   56.2 ml 23.43 ml/m LA Biplane Vol: 39.5 ml 16.46 ml/m  AORTIC VALVE LVOT Vmax:   86.20 cm/s LVOT Vmean:  53.800 cm/s LVOT VTI:    0.113 m  AORTA Ao Root diam: 3.70 cm MITRAL VALVE MV Area (PHT): 4.15 cm    SHUNTS MV Decel Time: 183 msec    Systemic VTI:  0.11 m MV E velocity: 39.40 cm/s  Systemic Diam: 2.10 cm MV A velocity: 61.70 cm/s MV E/A ratio:  0.64 Dixie Dials MD Electronically signed by Dixie Dials MD Signature Date/Time: 07/20/2019/5:04:53 PM    Final     Assessment/Plan Active Problems:   Acute on chronic respiratory failure with hypoxia ([place])   COVID-19 virus infection   Critical illness myopathy   Severe sepsis (HCC)   Pneumonia due to COVID-19 virus   1. Acute on chronic respiratory failure hypoxia we will continue with weaning as tolerated titrate oxygen as tolerated 2. COVID-19 virus infection clinically resolved 3. Critical illness myopathy supportive care 4. Severe sepsis resolved hemodynamics are stable 5. Pneumonia due to COVID-19 slow improvement   I have personally seen and evaluated the patient,  evaluated laboratory and imaging results, formulated the assessment and plan and placed orders. The Patient requires high complexity decision making with multiple systems involvement.  Rounds were done with the Respiratory Therapy Director and Staff therapists and discussed with nursing staff also.  Allyne Gee, MD [facility] Pulmonary Critical Care Medicine Sleep Medicine

## 2019-07-22 DIAGNOSIS — J9621 Acute and chronic respiratory failure with hypoxia: Secondary | ICD-10-CM | POA: Diagnosis not present

## 2019-07-22 DIAGNOSIS — U071 COVID-19: Secondary | ICD-10-CM | POA: Diagnosis not present

## 2019-07-22 DIAGNOSIS — G7281 Critical illness myopathy: Secondary | ICD-10-CM | POA: Diagnosis not present

## 2019-07-22 DIAGNOSIS — A419 Sepsis, unspecified organism: Secondary | ICD-10-CM | POA: Diagnosis not present

## 2019-07-22 MED ORDER — GENERIC EXTERNAL MEDICATION
Status: DC
Start: ? — End: 2019-07-22

## 2019-07-22 NOTE — Progress Notes (Addendum)
Pulmonary Critical Care Medicine [place]   PULMONARY CRITICAL CARE SERVICE  PROGRESS NOTE  Date of Service: 07/22/2019  Steve Parker  [id number]  DOB: 03-25-1966   DOA: 07/16/2019  Referring Physician: Merton Border, MD  HPI: Steve Parker is a 54 y.o. male seen for follow up of Acute on Chronic Respiratory Failure. Patient remains capped on 1 liter.  Currently has a 24 hour goal.  Sating well with no acute distress.   Medications: Reviewed on Rounds  Physical Exam:  Vitals: pulse 104, Resp 31, bp 143/80, o2 sat 97, temp 97.3  Ventilator Settings 1 liter [place]  . General: Comfortable at this time . Eyes: Grossly normal lids, irises & conjunctiva . ENT: grossly tongue is normal . Neck: no obvious mass . Cardiovascular: S1 S2 normal no gallop . Respiratory: no rales or ronchi . Abdomen: soft . Skin: no rash seen on limited exam . Musculoskeletal: not rigid . Psychiatric:unable to assess . Neurologic: no seizure no involuntary movements         Lab Data:   Basic Metabolic Panel: Recent Labs  Lab 07/17/19 0629 07/18/19 0714 07/19/19 0104 07/20/19 0639 07/21/19 0449  NA 143 146* 145 141  --   K 5.3* 4.3 4.8 5.4* 4.5  CL 96* 98 97* 92*  --   CO2 35* 39* 38* 38*  --   GLUCOSE 225* 360* 301* 252*  --   BUN 54* 62* 65* 62*  --   CREATININE 1.06 1.06 0.97 0.85  --   CALCIUM 9.3 8.8* 8.8* 8.9  --   MG 2.2 2.1 2.2 2.2  --   PHOS 5.1* 3.2  --  3.9  --     ABG: No results for input(s): PHART, PCO2ART, PO2ART, HCO3, O2SAT in the last 168 hours.  Liver Function Tests: Recent Labs  Lab 07/17/19 0629 07/18/19 0714 07/20/19 0639  AST 186*  --   --   ALT 279*  --   --   ALKPHOS 779*  --   --   BILITOT 1.2  --   --   PROT 7.6  --   --   ALBUMIN 2.0* 1.7* 1.7*   No results for input(s): LIPASE, AMYLASE in the last 168 hours. No results for input(s): AMMONIA in the last 168 hours.  CBC: Recent Labs  Lab 07/17/19 0629 07/18/19 0714  07/19/19 0104 07/20/19 0639  WBC 15.8* 14.5* 14.4* 12.8*  NEUTROABS 13.1*  --   --   --   HGB 12.1* 11.4* 11.1* 11.3*  HCT 41.6 39.5 39.4 39.9  MCV 98.3 98.3 100.3* 98.8  PLT 428* 433* 454* 505*    Cardiac Enzymes: No results for input(s): CKTOTAL, CKMB, CKMBINDEX, TROPONINI in the last 168 hours.  BNP (last 3 results) No results for input(s): BNP in the last 8760 hours.  ProBNP (last 3 results) No results for input(s): PROBNP in the last 8760 hours.  Radiological Exams: No results found.  Assessment/Plan Active Problems:   Acute on chronic respiratory failure with hypoxia ([place])   COVID-19 virus infection   Critical illness myopathy   Severe sepsis ([place])   Pneumonia due to COVID-19 virus   1. Acute on chronic respiratory failure hypoxia we will continue with weaning as tolerated titrate oxygen as tolerated 2. COVID-19 virus infection clinically resolved 3. Critical illness myopathy supportive care 4. Severe sepsis resolved hemodynamics are stable 5. Pneumonia due to COVID-19 slow improvement   I have personally seen and evaluated the  patient, evaluated laboratory and imaging results, formulated the assessment and plan and placed orders. The Patient requires high complexity decision making with multiple systems involvement.  Rounds were done with the Respiratory Therapy Director and Staff therapists and discussed with nursing staff also.  Allyne Gee, MD [facility] Pulmonary Critical Care Medicine Sleep Medicine

## 2019-07-23 DIAGNOSIS — G7281 Critical illness myopathy: Secondary | ICD-10-CM | POA: Diagnosis not present

## 2019-07-23 DIAGNOSIS — A419 Sepsis, unspecified organism: Secondary | ICD-10-CM | POA: Diagnosis not present

## 2019-07-23 DIAGNOSIS — J9621 Acute and chronic respiratory failure with hypoxia: Secondary | ICD-10-CM | POA: Diagnosis not present

## 2019-07-23 DIAGNOSIS — U071 COVID-19: Secondary | ICD-10-CM | POA: Diagnosis not present

## 2019-07-23 LAB — VANCOMYCIN, TROUGH: Vancomycin Tr: 18 ug/mL (ref 15–20)

## 2019-07-23 NOTE — Progress Notes (Signed)
Pulmonary Critical Care Medicine [place]   PULMONARY CRITICAL CARE SERVICE  PROGRESS NOTE  Date of Service: 07/23/2019  Steve Parker  [id number]  DOB: 1966/01/11   DOA: 07/16/2019  Referring Physician: Merton Border, MD  HPI: Steve Parker is a 54 y.o. male seen for follow up of Acute on Chronic Respiratory Failure.  Patient currently is capping doing well.  Secretions are fair to moderate  Medications: Reviewed on Rounds  Physical Exam:  Vitals: Temperature is 97.9 pulse 99 respiratory 24 blood pressure is 117/65 saturation 96%  Ventilator Settings capping off the ventilator  . General: Comfortable at this time . Eyes: Grossly normal lids, irises & conjunctiva . ENT: grossly tongue is normal . Neck: no obvious mass . Cardiovascular: S1 S2 normal no gallop . Respiratory: No rhonchi no rales are noted at this time . Abdomen: soft . Skin: no rash seen on limited exam . Musculoskeletal: not rigid . Psychiatric:unable to assess . Neurologic: no seizure no involuntary movements         Lab Data:   Basic Metabolic Panel: Recent Labs  Lab 07/17/19 0629 07/18/19 0714 07/19/19 0104 07/20/19 0639 07/21/19 0449  NA 143 146* 145 141  --   K 5.3* 4.3 4.8 5.4* 4.5  CL 96* 98 97* 92*  --   CO2 35* 39* 38* 38*  --   GLUCOSE 225* 360* 301* 252*  --   BUN 54* 62* 65* 62*  --   CREATININE 1.06 1.06 0.97 0.85  --   CALCIUM 9.3 8.8* 8.8* 8.9  --   MG 2.2 2.1 2.2 2.2  --   PHOS 5.1* 3.2  --  3.9  --     ABG: No results for input(s): PHART, PCO2ART, PO2ART, HCO3, O2SAT in the last 168 hours.  Liver Function Tests: Recent Labs  Lab 07/17/19 0629 07/18/19 0714 07/20/19 0639  AST 186*  --   --   ALT 279*  --   --   ALKPHOS 779*  --   --   BILITOT 1.2  --   --   PROT 7.6  --   --   ALBUMIN 2.0* 1.7* 1.7*   No results for input(s): LIPASE, AMYLASE in the last 168 hours. No results for input(s): AMMONIA in the last 168 hours.  CBC: Recent Labs   Lab 07/17/19 0629 07/18/19 0714 07/19/19 0104 07/20/19 0639  WBC 15.8* 14.5* 14.4* 12.8*  NEUTROABS 13.1*  --   --   --   HGB 12.1* 11.4* 11.1* 11.3*  HCT 41.6 39.5 39.4 39.9  MCV 98.3 98.3 100.3* 98.8  PLT 428* 433* 454* 505*    Cardiac Enzymes: No results for input(s): CKTOTAL, CKMB, CKMBINDEX, TROPONINI in the last 168 hours.  BNP (last 3 results) No results for input(s): BNP in the last 8760 hours.  ProBNP (last 3 results) No results for input(s): PROBNP in the last 8760 hours.  Radiological Exams: No results found.  Assessment/Plan Active Problems:   Acute on chronic respiratory failure with hypoxia ([place])   COVID-19 virus infection   Critical illness myopathy   Severe sepsis ([place])   Pneumonia due to COVID-19 virus   1. Acute on chronic respiratory failure hypoxia plan is to continue with capping trials titrate oxygen continue pulmonary toilet. 2. COVID-19 virus infection resolved we will continue with supportive care 3. Critical illness myopathy slow improvement we will continue to follow continue with therapy 4. Severe sepsis resolved 5. Pneumonia due to COVID-19  in resolution phase we will continue to follow along   I have personally seen and evaluated the patient, evaluated laboratory and imaging results, formulated the assessment and plan and placed orders. The Patient requires high complexity decision making with multiple systems involvement.  Rounds were done with the Respiratory Therapy Director and Staff therapists and discussed with nursing staff also.  Allyne Gee, MD [facility] Pulmonary Critical Care Medicine Sleep Medicine

## 2019-07-24 ENCOUNTER — Other Ambulatory Visit (HOSPITAL_COMMUNITY): Payer: Self-pay

## 2019-07-24 DIAGNOSIS — U071 COVID-19: Secondary | ICD-10-CM | POA: Diagnosis not present

## 2019-07-24 DIAGNOSIS — A419 Sepsis, unspecified organism: Secondary | ICD-10-CM | POA: Diagnosis not present

## 2019-07-24 DIAGNOSIS — J9621 Acute and chronic respiratory failure with hypoxia: Secondary | ICD-10-CM | POA: Diagnosis not present

## 2019-07-24 DIAGNOSIS — G7281 Critical illness myopathy: Secondary | ICD-10-CM | POA: Diagnosis not present

## 2019-07-24 LAB — URINALYSIS, ROUTINE W REFLEX MICROSCOPIC
Bilirubin Urine: NEGATIVE
Glucose, UA: NEGATIVE mg/dL
Hgb urine dipstick: NEGATIVE
Ketones, ur: NEGATIVE mg/dL
Leukocytes,Ua: NEGATIVE
Nitrite: NEGATIVE
Protein, ur: NEGATIVE mg/dL
Specific Gravity, Urine: 1.01 (ref 1.005–1.030)
pH: 7 (ref 5.0–8.0)

## 2019-07-24 LAB — CBC
HCT: 35.7 % — ABNORMAL LOW (ref 39.0–52.0)
Hemoglobin: 10.7 g/dL — ABNORMAL LOW (ref 13.0–17.0)
MCH: 28.3 pg (ref 26.0–34.0)
MCHC: 30 g/dL (ref 30.0–36.0)
MCV: 94.4 fL (ref 80.0–100.0)
Platelets: 521 10*3/uL — ABNORMAL HIGH (ref 150–400)
RBC: 3.78 MIL/uL — ABNORMAL LOW (ref 4.22–5.81)
RDW: 17.2 % — ABNORMAL HIGH (ref 11.5–15.5)
WBC: 15.9 10*3/uL — ABNORMAL HIGH (ref 4.0–10.5)
nRBC: 0.1 % (ref 0.0–0.2)

## 2019-07-24 LAB — BASIC METABOLIC PANEL
Anion gap: 11 (ref 5–15)
BUN: 54 mg/dL — ABNORMAL HIGH (ref 6–20)
CO2: 34 mmol/L — ABNORMAL HIGH (ref 22–32)
Calcium: 9.2 mg/dL (ref 8.9–10.3)
Chloride: 92 mmol/L — ABNORMAL LOW (ref 98–111)
Creatinine, Ser: 0.93 mg/dL (ref 0.61–1.24)
GFR calc Af Amer: >60 mL/min (ref >60)
GFR calc non Af Amer: >60 mL/min (ref >60)
Glucose, Bld: 181 mg/dL — ABNORMAL HIGH (ref 70–99)
Potassium: 4.5 mmol/L (ref 3.5–5.1)
Sodium: 137 mmol/L (ref 135–145)

## 2019-07-24 LAB — MAGNESIUM: Magnesium: 2 mg/dL (ref 1.7–2.4)

## 2019-07-24 MED ORDER — GENERIC EXTERNAL MEDICATION
Status: DC
Start: ? — End: 2019-07-24

## 2019-07-24 NOTE — Progress Notes (Signed)
Pulmonary Critical Care Medicine [place]   PULMONARY CRITICAL CARE SERVICE  PROGRESS NOTE  Date of Service: 07/24/2019  Steve Parker  [id number]  DOB: [birth year]   DOA: 07/16/2019  Referring Physician: Merton Border, MD  HPI: Steve Parker is a 54 y.o. male seen for follow up of Acute on Chronic Respiratory Failure.  Patient is doing well with capping trials right now goes 48 hours  Medications: Reviewed on Rounds  Physical Exam:  Vitals: Temperature 99.1 pulse 93 respiratory rate 32 blood pressure is 125/81 saturations 95%  Ventilator Settings capping currently  . General: Comfortable at this time . Eyes: Grossly normal lids, irises & conjunctiva . ENT: grossly tongue is normal . Neck: no obvious mass . Cardiovascular: S1 S2 normal no gallop . Respiratory: No rhonchi coarse breath sounds . Abdomen: soft . Skin: no rash seen on limited exam . Musculoskeletal: not rigid . Psychiatric:unable to assess . Neurologic: no seizure no involuntary movements         Lab Data:   Basic Metabolic Panel: Recent Labs  Lab 07/18/19 0714 07/19/19 0104 07/20/19 0639 07/21/19 0449 07/24/19 0505  NA 146* 145 141  --  137  K 4.3 4.8 5.4* 4.5 4.5  CL 98 97* 92*  --  92*  CO2 39* 38* 38*  --  34*  GLUCOSE 360* 301* 252*  --  181*  BUN 62* 65* 62*  --  54*  CREATININE 1.06 0.97 0.85  --  0.93  CALCIUM 8.8* 8.8* 8.9  --  9.2  MG 2.1 2.2 2.2  --  2.0  PHOS 3.2  --  3.9  --   --     ABG: No results for input(s): PHART, PCO2ART, PO2ART, HCO3, O2SAT in the last 168 hours.  Liver Function Tests: Recent Labs  Lab 07/18/19 0714 07/20/19 0639  ALBUMIN 1.7* 1.7*   No results for input(s): LIPASE, AMYLASE in the last 168 hours. No results for input(s): AMMONIA in the last 168 hours.  CBC: Recent Labs  Lab 07/18/19 0714 07/19/19 0104 07/20/19 0639 07/24/19 0505  WBC 14.5* 14.4* 12.8* 15.9*  HGB 11.4* 11.1* 11.3* 10.7*  HCT 39.5 39.4 39.9 35.7*  MCV  98.3 100.3* 98.8 94.4  PLT 433* 454* 505* 521*    Cardiac Enzymes: No results for input(s): CKTOTAL, CKMB, CKMBINDEX, TROPONINI in the last 168 hours.  BNP (last 3 results) No results for input(s): BNP in the last 8760 hours.  ProBNP (last 3 results) No results for input(s): PROBNP in the last 8760 hours.  Radiological Exams: No results found.  Assessment/Plan Active Problems:   Acute on chronic respiratory failure with hypoxia ([place])   COVID-19 virus infection   Critical illness myopathy   Severe sepsis ([place])   Pneumonia due to COVID-19 virus   1. Acute on chronic respiratory failure hypoxia we will continue with capping trials patient is on a goal of 48 hours 2. COVID-19 virus infection treated we will continue with supportive care 3. Critical illness myopathy at baseline we will continue supportive care 4. Severe sepsis hemodynamics are stable 5. Pneumonia due to COVID-19 treated continue with supportive care   I have personally seen and evaluated the patient, evaluated laboratory and imaging results, formulated the assessment and plan and placed orders. The Patient requires high complexity decision making with multiple systems involvement.  Rounds were done with the Respiratory Therapy Director and Staff therapists and discussed with nursing staff also.  Allyne Gee, MD [facility]  Pulmonary Critical Care Medicine Sleep Medicine

## 2019-07-25 ENCOUNTER — Other Ambulatory Visit (HOSPITAL_COMMUNITY): Payer: Self-pay

## 2019-07-25 DIAGNOSIS — J9621 Acute and chronic respiratory failure with hypoxia: Secondary | ICD-10-CM | POA: Diagnosis not present

## 2019-07-25 DIAGNOSIS — G7281 Critical illness myopathy: Secondary | ICD-10-CM | POA: Diagnosis not present

## 2019-07-25 DIAGNOSIS — U071 COVID-19: Secondary | ICD-10-CM | POA: Diagnosis not present

## 2019-07-25 DIAGNOSIS — A419 Sepsis, unspecified organism: Secondary | ICD-10-CM | POA: Diagnosis not present

## 2019-07-25 LAB — URINE CULTURE: Culture: <10000 — AB

## 2019-07-25 MED ORDER — GENERIC EXTERNAL MEDICATION
Status: DC
Start: ? — End: 2019-07-25

## 2019-07-25 NOTE — Progress Notes (Signed)
Pulmonary Critical Care Medicine [place]   PULMONARY CRITICAL CARE SERVICE  PROGRESS NOTE  Date of Service: 07/25/2019  Steve Parker  [id number]  DOB: Sep 03, 1965   DOA: 07/16/2019  Referring Physician: Merton Border, MD  HPI: Steve Parker is a 54 y.o. male seen for follow up of Acute on Chronic Respiratory Failure.  Patient decannulated himself overnight doing fine right now  Medications: Reviewed on Rounds  Physical Exam:  Vitals: Temperature is 97.1 pulse 92 respiratory rate 16 blood pressure is 145/92 saturations 99%  Ventilator Settings decannulated  . General: Comfortable at this time . Eyes: Grossly normal lids, irises & conjunctiva . ENT: grossly tongue is normal . Neck: no obvious mass . Cardiovascular: S1 S2 normal no gallop . Respiratory: No rhonchi no rales are noted . Abdomen: soft . Skin: no rash seen on limited exam . Musculoskeletal: not rigid . Psychiatric:unable to assess . Neurologic: no seizure no involuntary movements         Lab Data:   Basic Metabolic Panel: Recent Labs  Lab 07/19/19 0104 07/20/19 0639 07/21/19 0449 07/24/19 0505  NA 145 141  --  137  K 4.8 5.4* 4.5 4.5  CL 97* 92*  --  92*  CO2 38* 38*  --  34*  GLUCOSE 301* 252*  --  181*  BUN 65* 62*  --  54*  CREATININE 0.97 0.85  --  0.93  CALCIUM 8.8* 8.9  --  9.2  MG 2.2 2.2  --  2.0  PHOS  --  3.9  --   --     ABG: No results for input(s): PHART, PCO2ART, PO2ART, HCO3, O2SAT in the last 168 hours.  Liver Function Tests: Recent Labs  Lab 07/20/19 0639  ALBUMIN 1.7*   No results for input(s): LIPASE, AMYLASE in the last 168 hours. No results for input(s): AMMONIA in the last 168 hours.  CBC: Recent Labs  Lab 07/19/19 0104 07/20/19 0639 07/24/19 0505  WBC 14.4* 12.8* 15.9*  HGB 11.1* 11.3* 10.7*  HCT 39.4 39.9 35.7*  MCV 100.3* 98.8 94.4  PLT 454* 505* 521*    Cardiac Enzymes: No results for input(s): CKTOTAL, CKMB, CKMBINDEX,  TROPONINI in the last 168 hours.  BNP (last 3 results) No results for input(s): BNP in the last 8760 hours.  ProBNP (last 3 results) No results for input(s): PROBNP in the last 8760 hours.  Radiological Exams: DG CHEST PORT 1 VIEW  Result Date: 07/25/2019 CLINICAL DATA:  Low-grade fever. EXAM: PORTABLE CHEST 1 VIEW COMPARISON:  07/16/2019 FINDINGS: Enteric tube courses into the region of the stomach and off the film as tip is not visualized. Lungs are somewhat hypoinflated with elevation of the right hemidiaphragm unchanged. Patchy streaky density over the right lung with minimal opacification over the left base likely atelectasis although infection is possible. No evidence of effusion. Cardiomediastinal silhouette and remainder of the exam is unchanged. IMPRESSION: Patchy streaky density over the right lung and left base likely atelectasis although infection is possible. Enteric tube coursing into the region of the stomach as tip is not visualized. Electronically Signed   By: Marin Olp M.D.   On: 07/25/2019 08:17    Assessment/Plan Active Problems:   Acute on chronic respiratory failure with hypoxia ([place])   COVID-19 virus infection   Critical illness myopathy   Severe sepsis ([place])   Pneumonia due to COVID-19 virus   1. Acute on chronic respiratory failure hypoxia we will continue with supportive care  oxygen as necessary patient is doing fine after self decannulation 2. COVID-19 virus infection resolved 3. Critical illness myopathy no change we will continue to monitor 4. Severe sepsis resolved 5. Pneumonia due to COVID-19 slow improvement   I have personally seen and evaluated the patient, evaluated laboratory and imaging results, formulated the assessment and plan and placed orders. The Patient requires high complexity decision making with multiple systems involvement.  Rounds were done with the Respiratory Therapy Director and Staff therapists and discussed with nursing staff  also.  Allyne Gee, MD [facility] Pulmonary Critical Care Medicine Sleep Medicine

## 2019-07-26 DIAGNOSIS — U071 COVID-19: Secondary | ICD-10-CM | POA: Diagnosis not present

## 2019-07-26 DIAGNOSIS — A419 Sepsis, unspecified organism: Secondary | ICD-10-CM | POA: Diagnosis not present

## 2019-07-26 DIAGNOSIS — J9621 Acute and chronic respiratory failure with hypoxia: Secondary | ICD-10-CM | POA: Diagnosis not present

## 2019-07-26 DIAGNOSIS — G7281 Critical illness myopathy: Secondary | ICD-10-CM | POA: Diagnosis not present

## 2019-07-26 NOTE — Progress Notes (Signed)
Pulmonary Critical Care Medicine [place]   PULMONARY CRITICAL CARE SERVICE  PROGRESS NOTE  Date of Service: 07/26/2019  SERGEY CRAIGEN  [id number]  DOB: [birth year]   DOA: 07/16/2019  Referring Physician: Merton Border, MD  HPI: Steve Parker is a 54 y.o. male seen for follow up of Acute on Chronic Respiratory Failure.  Patient is decannulated right now on room air  Medications: Reviewed on Rounds  Physical Exam:  Vitals: Temperature is 96.9 pulse 87 respiratory 28 blood pressure is 150/87 saturations 98%  Ventilator Settings decannulated off the vent  . General: Comfortable at this time . Eyes: Grossly normal lids, irises & conjunctiva . ENT: grossly tongue is normal . Neck: no obvious mass . Cardiovascular: S1 S2 normal no gallop . Respiratory: No rhonchi coarse breath sounds . Abdomen: soft . Skin: no rash seen on limited exam . Musculoskeletal: not rigid . Psychiatric:unable to assess . Neurologic: no seizure no involuntary movements         Lab Data:   Basic Metabolic Panel: Recent Labs  Lab 07/20/19 0639 07/21/19 0449 07/24/19 0505  NA 141  --  137  K 5.4* 4.5 4.5  CL 92*  --  92*  CO2 38*  --  34*  GLUCOSE 252*  --  181*  BUN 62*  --  54*  CREATININE 0.85  --  0.93  CALCIUM 8.9  --  9.2  MG 2.2  --  2.0  PHOS 3.9  --   --     ABG: No results for input(s): PHART, PCO2ART, PO2ART, HCO3, O2SAT in the last 168 hours.  Liver Function Tests: Recent Labs  Lab 07/20/19 0639  ALBUMIN 1.7*   No results for input(s): LIPASE, AMYLASE in the last 168 hours. No results for input(s): AMMONIA in the last 168 hours.  CBC: Recent Labs  Lab 07/20/19 0639 07/24/19 0505  WBC 12.8* 15.9*  HGB 11.3* 10.7*  HCT 39.9 35.7*  MCV 98.8 94.4  PLT 505* 521*    Cardiac Enzymes: No results for input(s): CKTOTAL, CKMB, CKMBINDEX, TROPONINI in the last 168 hours.  BNP (last 3 results) No results for input(s): BNP in the last 8760  hours.  ProBNP (last 3 results) No results for input(s): PROBNP in the last 8760 hours.  Radiological Exams: DG CHEST PORT 1 VIEW  Result Date: 07/25/2019 CLINICAL DATA:  Low-grade fever. EXAM: PORTABLE CHEST 1 VIEW COMPARISON:  07/16/2019 FINDINGS: Enteric tube courses into the region of the stomach and off the film as tip is not visualized. Lungs are somewhat hypoinflated with elevation of the right hemidiaphragm unchanged. Patchy streaky density over the right lung with minimal opacification over the left base likely atelectasis although infection is possible. No evidence of effusion. Cardiomediastinal silhouette and remainder of the exam is unchanged. IMPRESSION: Patchy streaky density over the right lung and left base likely atelectasis although infection is possible. Enteric tube coursing into the region of the stomach as tip is not visualized. Electronically Signed   By: Marin Olp M.D.   On: 07/25/2019 08:17    Assessment/Plan Active Problems:   Acute on chronic respiratory failure with hypoxia ([place])   COVID-19 virus infection   Critical illness myopathy   Severe sepsis ([place])   Pneumonia due to COVID-19 virus   1. Acute on chronic respiratory failure hypoxia doing well we will sign off 2. COVID-19 virus infection resolved 3. Critical illness myopathy slow improvement 4. Severe sepsis resolved 5. Pneumonia due to COVID-19  treated clinically improved   I have personally seen and evaluated the patient, evaluated laboratory and imaging results, formulated the assessment and plan and placed orders. The Patient requires high complexity decision making with multiple systems involvement.  Rounds were done with the Respiratory Therapy Director and Staff therapists and discussed with nursing staff also.  Allyne Gee, MD [facility] Pulmonary Critical Care Medicine Sleep Medicine

## 2019-07-27 LAB — CBC
HCT: 39.9 % (ref 39.0–52.0)
Hemoglobin: 12.2 g/dL — ABNORMAL LOW (ref 13.0–17.0)
MCH: 28.4 pg (ref 26.0–34.0)
MCHC: 30.6 g/dL (ref 30.0–36.0)
MCV: 93 fL (ref 80.0–100.0)
Platelets: 470 10*3/uL — ABNORMAL HIGH (ref 150–400)
RBC: 4.29 MIL/uL (ref 4.22–5.81)
RDW: 17.8 % — ABNORMAL HIGH (ref 11.5–15.5)
WBC: 14.8 10*3/uL — ABNORMAL HIGH (ref 4.0–10.5)
nRBC: 0 % (ref 0.0–0.2)

## 2019-07-27 LAB — BASIC METABOLIC PANEL
Anion gap: 12 (ref 5–15)
BUN: 26 mg/dL — ABNORMAL HIGH (ref 6–20)
CO2: 29 mmol/L (ref 22–32)
Calcium: 9 mg/dL (ref 8.9–10.3)
Chloride: 92 mmol/L — ABNORMAL LOW (ref 98–111)
Creatinine, Ser: 0.78 mg/dL (ref 0.61–1.24)
GFR calc Af Amer: >60 mL/min (ref >60)
GFR calc non Af Amer: >60 mL/min (ref >60)
Glucose, Bld: 132 mg/dL — ABNORMAL HIGH (ref 70–99)
Potassium: 4.4 mmol/L (ref 3.5–5.1)
Sodium: 133 mmol/L — ABNORMAL LOW (ref 135–145)

## 2019-07-27 LAB — MAGNESIUM: Magnesium: 1.7 mg/dL (ref 1.7–2.4)

## 2019-07-27 MED ORDER — GENERIC EXTERNAL MEDICATION
Status: DC
Start: ? — End: 2019-07-27

## 2019-07-28 LAB — CULTURE, BLOOD (ROUTINE X 2)
Culture: NO GROWTH
Culture: NO GROWTH
Special Requests: ADEQUATE

## 2019-07-28 LAB — VANCOMYCIN, TROUGH: Vancomycin Tr: 20 ug/mL (ref 15–20)

## 2019-07-29 MED ORDER — GENERIC EXTERNAL MEDICATION
Status: DC
Start: ? — End: 2019-07-29

## 2019-07-30 LAB — CBC
HCT: 40.9 % (ref 39.0–52.0)
Hemoglobin: 12.3 g/dL — ABNORMAL LOW (ref 13.0–17.0)
MCH: 28.6 pg (ref 26.0–34.0)
MCHC: 30.1 g/dL (ref 30.0–36.0)
MCV: 95.1 fL (ref 80.0–100.0)
Platelets: 460 10*3/uL — ABNORMAL HIGH (ref 150–400)
RBC: 4.3 MIL/uL (ref 4.22–5.81)
RDW: 18.4 % — ABNORMAL HIGH (ref 11.5–15.5)
WBC: 13.1 10*3/uL — ABNORMAL HIGH (ref 4.0–10.5)
nRBC: 0 % (ref 0.0–0.2)

## 2019-07-30 LAB — BASIC METABOLIC PANEL
Anion gap: 10 (ref 5–15)
BUN: 23 mg/dL — ABNORMAL HIGH (ref 6–20)
CO2: 36 mmol/L — ABNORMAL HIGH (ref 22–32)
Calcium: 9.1 mg/dL (ref 8.9–10.3)
Chloride: 91 mmol/L — ABNORMAL LOW (ref 98–111)
Creatinine, Ser: 0.78 mg/dL (ref 0.61–1.24)
GFR calc Af Amer: >60 mL/min (ref >60)
GFR calc non Af Amer: >60 mL/min (ref >60)
Glucose, Bld: 120 mg/dL — ABNORMAL HIGH (ref 70–99)
Potassium: 4.3 mmol/L (ref 3.5–5.1)
Sodium: 137 mmol/L (ref 135–145)

## 2019-07-30 LAB — PHOSPHORUS: Phosphorus: 4.4 mg/dL (ref 2.5–4.6)

## 2019-07-30 LAB — MAGNESIUM: Magnesium: 2 mg/dL (ref 1.7–2.4)

## 2019-07-31 MED ORDER — GENERIC EXTERNAL MEDICATION
Status: DC
Start: ? — End: 2019-07-31

## 2019-08-02 MED ORDER — GENERIC EXTERNAL MEDICATION
Status: DC
Start: ? — End: 2019-08-02

## 2019-08-02 MED ORDER — Medication
Status: DC
Start: ? — End: 2019-08-02

## 2019-08-03 LAB — CBC
HCT: 39.4 % (ref 39.0–52.0)
Hemoglobin: 11.9 g/dL — ABNORMAL LOW (ref 13.0–17.0)
MCH: 29 pg (ref 26.0–34.0)
MCHC: 30.2 g/dL (ref 30.0–36.0)
MCV: 96.1 fL (ref 80.0–100.0)
Platelets: 442 10*3/uL — ABNORMAL HIGH (ref 150–400)
RBC: 4.1 MIL/uL — ABNORMAL LOW (ref 4.22–5.81)
RDW: 18.9 % — ABNORMAL HIGH (ref 11.5–15.5)
WBC: 12.3 10*3/uL — ABNORMAL HIGH (ref 4.0–10.5)
nRBC: 0 % (ref 0.0–0.2)

## 2019-08-03 LAB — BASIC METABOLIC PANEL
Anion gap: 12 (ref 5–15)
BUN: 15 mg/dL (ref 6–20)
CO2: 30 mmol/L (ref 22–32)
Calcium: 8.7 mg/dL — ABNORMAL LOW (ref 8.9–10.3)
Chloride: 95 mmol/L — ABNORMAL LOW (ref 98–111)
Creatinine, Ser: 0.85 mg/dL (ref 0.61–1.24)
GFR calc Af Amer: >60 mL/min (ref >60)
GFR calc non Af Amer: >60 mL/min (ref >60)
Glucose, Bld: 191 mg/dL — ABNORMAL HIGH (ref 70–99)
Potassium: 4.3 mmol/L (ref 3.5–5.1)
Sodium: 137 mmol/L (ref 135–145)

## 2019-08-03 LAB — MAGNESIUM: Magnesium: 1.7 mg/dL (ref 1.7–2.4)

## 2019-08-04 LAB — VANCOMYCIN, TROUGH: Vancomycin Tr: 19 ug/mL (ref 15–20)

## 2019-08-04 LAB — MAGNESIUM: Magnesium: 2.1 mg/dL (ref 1.7–2.4)

## 2019-08-04 MED ORDER — Medication
Status: DC
Start: ? — End: 2019-08-04

## 2019-08-04 MED ORDER — GENERIC EXTERNAL MEDICATION
Status: DC
Start: ? — End: 2019-08-04

## 2019-08-06 MED ORDER — GENERIC EXTERNAL MEDICATION
Status: DC
Start: ? — End: 2019-08-06

## 2019-11-17 ENCOUNTER — Inpatient Hospital Stay
Admission: AD | Admit: 2019-11-17 | Payer: Self-pay | Source: Other Acute Inpatient Hospital | Admitting: Internal Medicine

## 2020-02-24 ENCOUNTER — Telehealth: Payer: Self-pay | Admitting: Oncology

## 2020-02-24 NOTE — Telephone Encounter (Signed)
Scheduled appointments. Patient aware of appointments dates and times.

## 2020-03-16 ENCOUNTER — Telehealth: Payer: Self-pay | Admitting: Oncology

## 2020-03-16 NOTE — Telephone Encounter (Signed)
Per 12/1 Staff Message, please CX patient's Appt for 12/2.  He is Inpatient per spouse

## 2020-03-17 ENCOUNTER — Inpatient Hospital Stay: Payer: PRIVATE HEALTH INSURANCE | Admitting: Oncology

## 2020-03-17 ENCOUNTER — Inpatient Hospital Stay: Payer: PRIVATE HEALTH INSURANCE

## 2020-03-23 ENCOUNTER — Encounter: Payer: Self-pay | Admitting: General Practice

## 2021-08-01 IMAGING — DX DG CHEST 1V PORT
1 series · 1 of 1 positions shown · non-contrast
Comparison: Radiograph 06/12/2019 at [facility]

CLINICAL DATA: New admission [facility]. Pneumonia.

EXAM:
PORTABLE CHEST 1 VIEW

[chest]
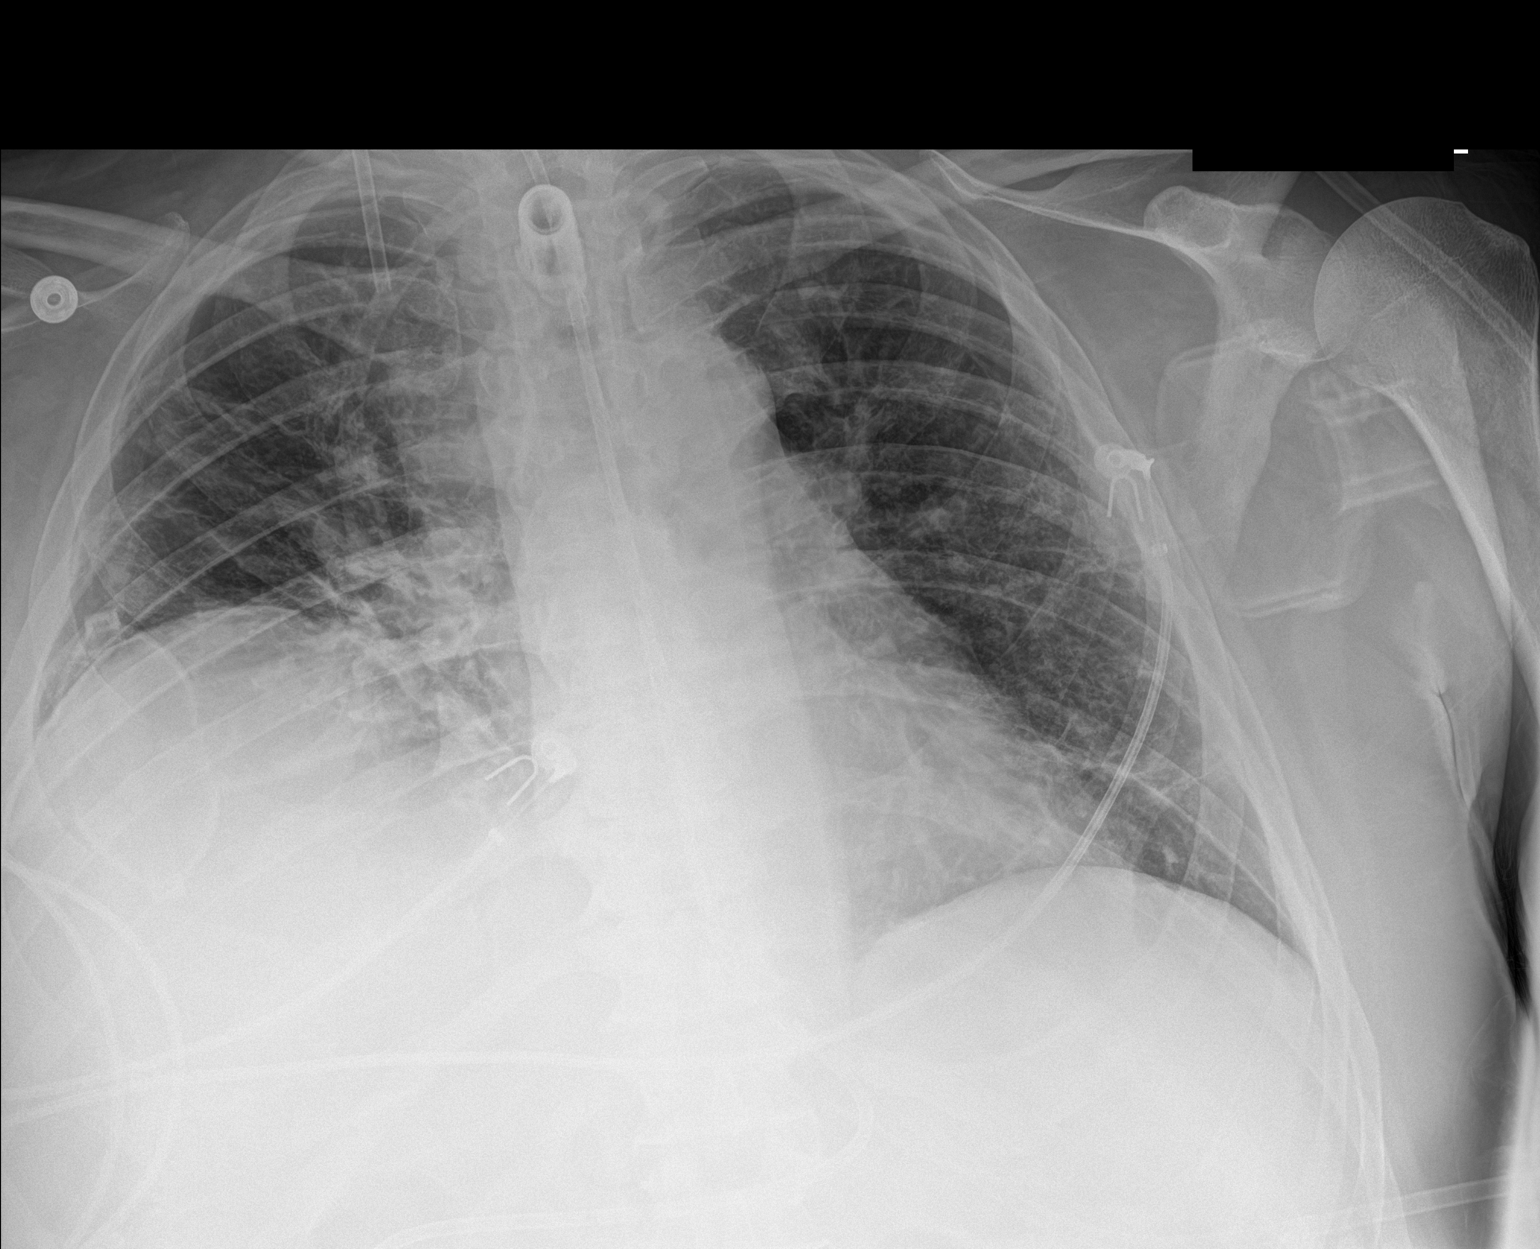

[1 of 1 positions shown; findings below may reference images not displayed]

FINDINGS: Tracheostomy tube tip at the thoracic inlet. Enteric tube in place
with tip below the diaphragm not included in the field of view.
Progressive elevation of right hemidiaphragm. Heterogeneous
bilateral lung opacities which have improved from prior exam. Upper
normal heart size with grossly unchanged mediastinal contours. No
pneumothorax. No large pleural effusion.
IMPRESSION: 1. Heterogeneous bilateral lung opacities, improved from exam 5
weeks ago, likely resolving pneumonia.
2. Progressive elevation of right hemidiaphragm.
3. Tracheostomy tube tip at the thoracic inlet.

## 2021-08-10 IMAGING — DX DG CHEST 1V PORT
1 series · 1 of 1 positions shown · non-contrast
Comparison: 07/16/2019

CLINICAL DATA: Low-grade fever.

EXAM:
PORTABLE CHEST 1 VIEW

[chest ap]
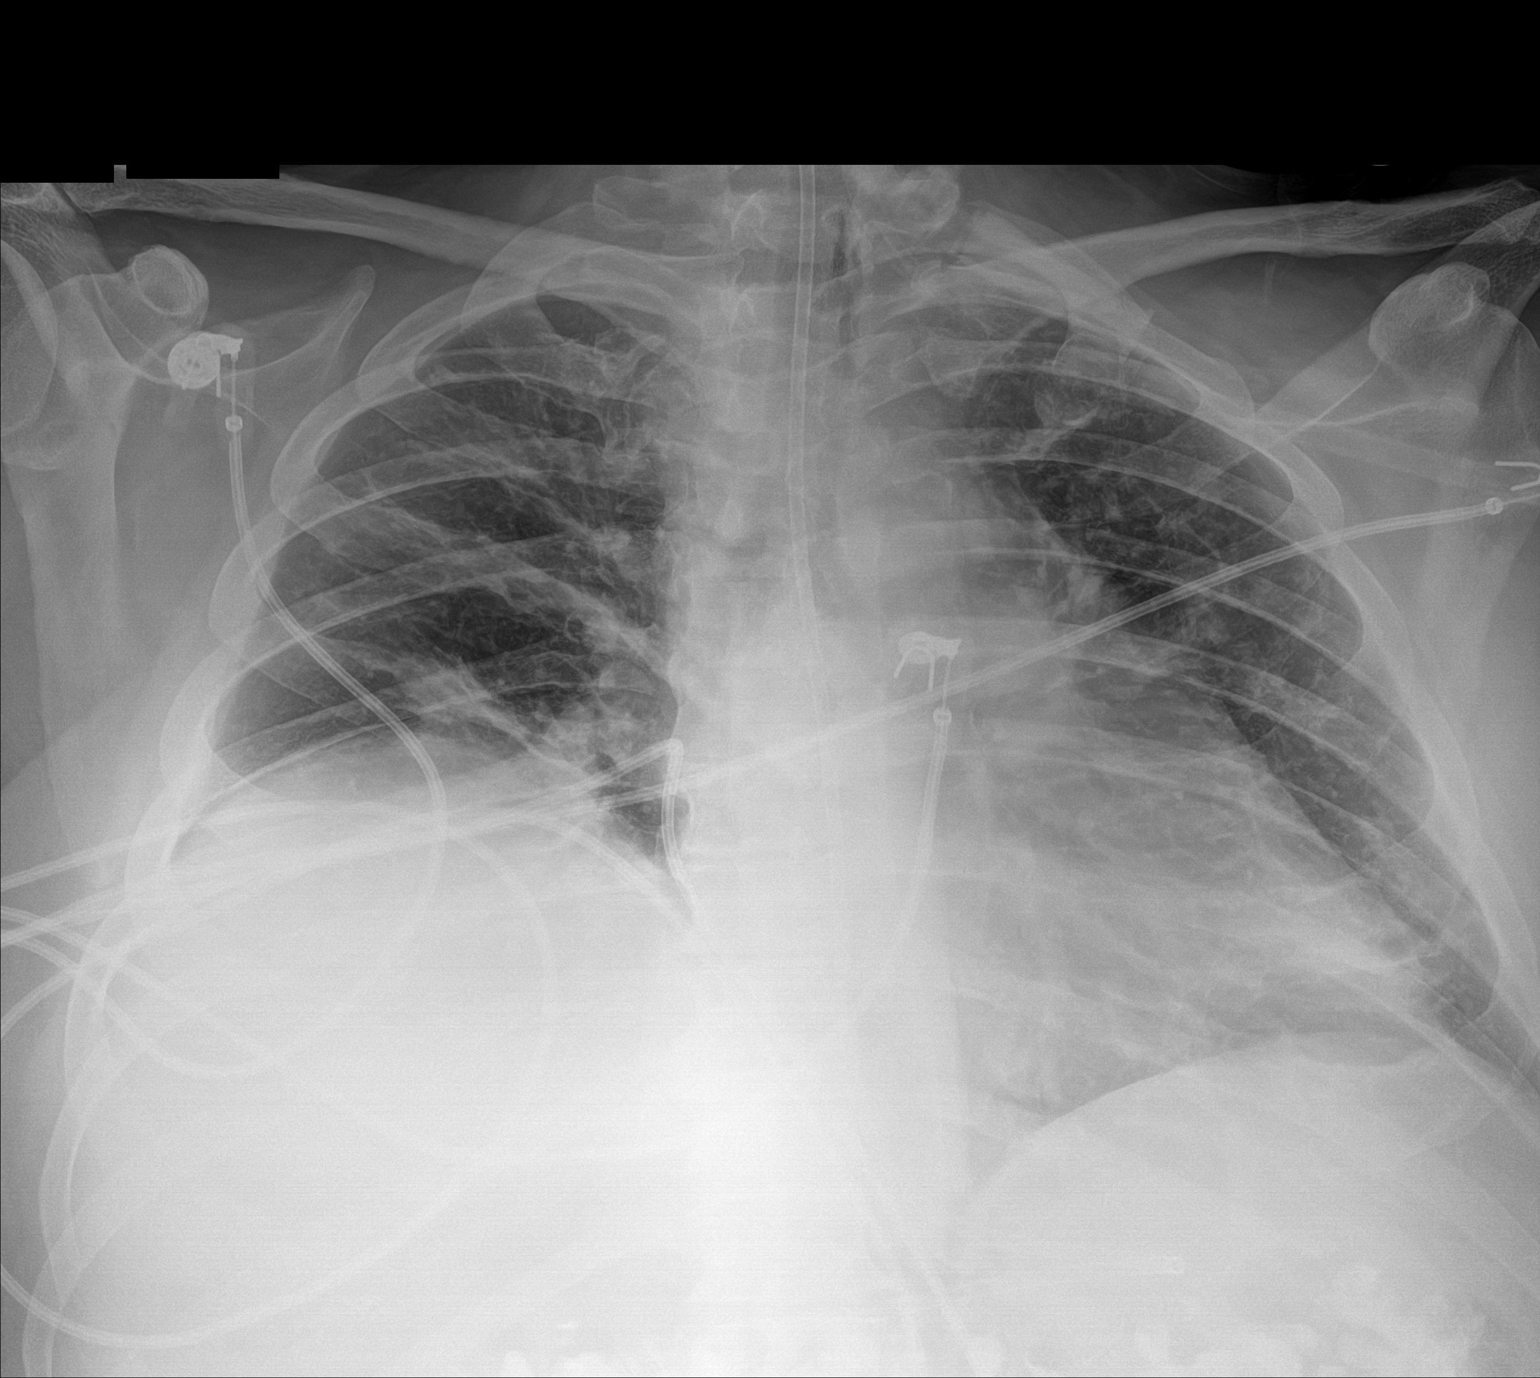

[1 of 1 positions shown; findings below may reference images not displayed]

FINDINGS: Enteric tube courses into the region of the stomach and off the film
as tip is not visualized. Lungs are somewhat hypoinflated with
elevation of the right hemidiaphragm unchanged. Patchy streaky
density over the right lung with minimal opacification over the left
base likely atelectasis although infection is possible. No evidence
of effusion. Cardiomediastinal silhouette and remainder of the exam
is unchanged.
IMPRESSION: Patchy streaky density over the right lung and left base likely
atelectasis although infection is possible.

Enteric tube coursing into the region of the stomach as tip is not
visualized.

## 2022-02-05 DIAGNOSIS — K432 Incisional hernia without obstruction or gangrene: Secondary | ICD-10-CM
